# Patient Record
Sex: Male | Born: 1985 | Race: Black or African American | Hispanic: No | Marital: Single | Smoking: Current every day smoker
Health system: Southern US, Community
[De-identification: ages and names within clinical notes are randomized; demographics above are authoritative.]

## PROBLEM LIST (undated history)

## (undated) DIAGNOSIS — G40909 Epilepsy, unspecified, not intractable, without status epilepticus: Secondary | ICD-10-CM

## (undated) DIAGNOSIS — R569 Unspecified convulsions: Secondary | ICD-10-CM

---
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
## 2004-03-23 ENCOUNTER — Emergency Department (HOSPITAL_COMMUNITY): Admission: EM | Admit: 2004-03-23 | Discharge: 2004-03-23 | Payer: Self-pay | Admitting: Emergency Medicine

## 2006-07-10 ENCOUNTER — Emergency Department (HOSPITAL_COMMUNITY): Admission: EM | Admit: 2006-07-10 | Discharge: 2006-07-10 | Payer: Self-pay | Admitting: Emergency Medicine

## 2006-07-30 ENCOUNTER — Emergency Department (HOSPITAL_COMMUNITY): Admission: EM | Admit: 2006-07-30 | Discharge: 2006-07-30 | Payer: Self-pay | Admitting: Family Medicine

## 2007-09-10 ENCOUNTER — Emergency Department (HOSPITAL_COMMUNITY): Admission: EM | Admit: 2007-09-10 | Discharge: 2007-09-10 | Payer: Self-pay | Admitting: Emergency Medicine

## 2009-01-04 ENCOUNTER — Emergency Department (HOSPITAL_COMMUNITY): Admission: EM | Admit: 2009-01-04 | Discharge: 2009-01-04 | Payer: Self-pay | Admitting: Emergency Medicine

## 2009-06-24 ENCOUNTER — Inpatient Hospital Stay (HOSPITAL_COMMUNITY): Admission: EM | Admit: 2009-06-24 | Discharge: 2009-06-25 | Payer: Self-pay | Admitting: Emergency Medicine

## 2009-12-01 ENCOUNTER — Emergency Department (HOSPITAL_COMMUNITY): Admission: EM | Admit: 2009-12-01 | Discharge: 2009-12-01 | Payer: Self-pay | Admitting: Internal Medicine

## 2010-04-02 ENCOUNTER — Emergency Department (HOSPITAL_COMMUNITY)
Admission: EM | Admit: 2010-04-02 | Discharge: 2010-04-02 | Payer: Self-pay | Source: Home / Self Care | Admitting: Emergency Medicine

## 2010-06-16 LAB — URINALYSIS, ROUTINE W REFLEX MICROSCOPIC
Bilirubin Urine: NEGATIVE
Nitrite: NEGATIVE
Specific Gravity, Urine: 1.021 (ref 1.005–1.030)
Urobilinogen, UA: 0.2 mg/dL (ref 0.0–1.0)
pH: 6 (ref 5.0–8.0)

## 2010-06-16 LAB — DIFFERENTIAL
Basophils Absolute: 0 10*3/uL (ref 0.0–0.1)
Lymphocytes Relative: 24 % (ref 12–46)
Lymphs Abs: 2 10*3/uL (ref 0.7–4.0)
Monocytes Absolute: 0.6 10*3/uL (ref 0.1–1.0)
Monocytes Relative: 7 % (ref 3–12)
Neutro Abs: 5.7 10*3/uL (ref 1.7–7.7)
Neutrophils Relative %: 67 % (ref 43–77)

## 2010-06-16 LAB — ETHANOL: Alcohol, Ethyl (B): <5 mg/dL (ref 0–10)

## 2010-06-16 LAB — CBC
MCH: 30.8 pg (ref 26.0–34.0)
MCV: 91.4 fL (ref 78.0–100.0)
Platelets: 168 10*3/uL (ref 150–400)
RBC: 4.87 MIL/uL (ref 4.22–5.81)

## 2010-06-16 LAB — RAPID URINE DRUG SCREEN, HOSP PERFORMED
Amphetamines: NOT DETECTED
Barbiturates: NOT DETECTED

## 2010-06-16 LAB — BASIC METABOLIC PANEL
Creatinine, Ser: 1.35 mg/dL (ref 0.4–1.5)
GFR calc Af Amer: >60 mL/min (ref >60)
GFR calc non Af Amer: >60 mL/min (ref >60)
Sodium: 137 mEq/L (ref 135–145)

## 2010-06-16 LAB — URINE MICROSCOPIC-ADD ON

## 2010-06-29 LAB — COMPREHENSIVE METABOLIC PANEL
ALT: 20 U/L (ref 0–53)
ALT: 23 U/L (ref 0–53)
AST: 28 U/L (ref 0–37)
AST: 34 U/L (ref 0–37)
Albumin: 3.3 g/dL — ABNORMAL LOW (ref 3.5–5.2)
Alkaline Phosphatase: 77 U/L (ref 39–117)
Alkaline Phosphatase: 78 U/L (ref 39–117)
CO2: 24 mEq/L (ref 19–32)
CO2: 25 mEq/L (ref 19–32)
Calcium: 8.5 mg/dL (ref 8.4–10.5)
Chloride: 105 mEq/L (ref 96–112)
GFR calc Af Amer: >60 mL/min (ref >60)
GFR calc Af Amer: >60 mL/min (ref >60)
GFR calc non Af Amer: >60 mL/min (ref >60)
GFR calc non Af Amer: >60 mL/min (ref >60)
Glucose, Bld: 138 mg/dL — ABNORMAL HIGH (ref 70–99)
Potassium: 3.7 mEq/L (ref 3.5–5.1)
Sodium: 136 mEq/L (ref 135–145)
Sodium: 136 mEq/L (ref 135–145)
Total Protein: 7.8 g/dL (ref 6.0–8.3)
Total Protein: 7.9 g/dL (ref 6.0–8.3)

## 2010-06-29 LAB — URINE MICROSCOPIC-ADD ON

## 2010-06-29 LAB — PROTEIN AND GLUCOSE, CSF
Glucose, CSF: 87 mg/dL — ABNORMAL HIGH (ref 43–76)
Total  Protein, CSF: 28 mg/dL (ref 15–45)

## 2010-06-29 LAB — URINALYSIS, ROUTINE W REFLEX MICROSCOPIC
Glucose, UA: NEGATIVE mg/dL
Ketones, ur: NEGATIVE mg/dL
Leukocytes, UA: NEGATIVE
Protein, ur: NEGATIVE mg/dL
Urobilinogen, UA: 0.2 mg/dL (ref 0.0–1.0)

## 2010-06-29 LAB — RAPID URINE DRUG SCREEN, HOSP PERFORMED
Amphetamines: NOT DETECTED
Benzodiazepines: NOT DETECTED
Opiates: NOT DETECTED

## 2010-06-29 LAB — DIFFERENTIAL
Eosinophils Relative: 1 % (ref 0–5)
Lymphs Abs: 2.3 10*3/uL (ref 0.7–4.0)
Neutro Abs: 10.9 10*3/uL — ABNORMAL HIGH (ref 1.7–7.7)
Neutrophils Relative %: 77 % (ref 43–77)

## 2010-06-29 LAB — CSF CELL COUNT WITH DIFFERENTIAL

## 2010-06-29 LAB — CSF CULTURE W GRAM STAIN: Culture: NO GROWTH

## 2010-06-29 LAB — BLOOD GAS, VENOUS
Bicarbonate: 25.3 mEq/L — ABNORMAL HIGH (ref 20.0–24.0)
O2 Saturation: 69.3 %
Patient temperature: 98.6
TCO2: 23.3 mmol/L (ref 0–100)
pCO2, Ven: 52.2 mmHg — ABNORMAL HIGH (ref 45.0–50.0)
pH, Ven: 7.306 — ABNORMAL HIGH (ref 7.250–7.300)
pO2, Ven: 41.7 mmHg (ref 30.0–45.0)

## 2010-06-29 LAB — CARDIAC PANEL(CRET KIN+CKTOT+MB+TROPI)
CK, MB: 1.8 ng/mL (ref 0.3–4.0)
Relative Index: 0.3 (ref 0.0–2.5)
Total CK: 543 U/L — ABNORMAL HIGH (ref 7–232)

## 2010-06-29 LAB — HIV ANTIBODY (ROUTINE TESTING W REFLEX): HIV: NONREACTIVE

## 2010-06-29 LAB — CBC
MCHC: 33 g/dL (ref 30.0–36.0)
MCHC: 33.1 g/dL (ref 30.0–36.0)
RBC: 4.15 MIL/uL — ABNORMAL LOW (ref 4.22–5.81)
RBC: 4.36 MIL/uL (ref 4.22–5.81)
RDW: 14.2 % (ref 11.5–15.5)
WBC: 7.9 10*3/uL (ref 4.0–10.5)

## 2010-06-29 LAB — HEPATITIS PANEL, ACUTE: Hep B C IgM: NEGATIVE

## 2010-06-29 LAB — T.PALLIDUM AB, IGG: T pallidum Antibodies (TP-PA): 1.6 IV — ABNORMAL HIGH (ref <1.0)

## 2010-06-29 LAB — HIV-1 RNA QUANT-NO REFLEX-BLD: HIV 1 RNA Quant: <48 copies/mL (ref <48)

## 2010-06-29 LAB — D-DIMER, QUANTITATIVE: D-Dimer, Quant: 0.49 ug/mL-FEU — ABNORMAL HIGH (ref 0.00–0.48)

## 2010-06-29 LAB — CK TOTAL AND CKMB (NOT AT ARMC): Total CK: 590 U/L — ABNORMAL HIGH (ref 7–232)

## 2010-07-10 LAB — BASIC METABOLIC PANEL
Calcium: 9.3 mg/dL (ref 8.4–10.5)
GFR calc Af Amer: >60 mL/min (ref >60)
GFR calc non Af Amer: >60 mL/min (ref >60)
Sodium: 135 mEq/L (ref 135–145)

## 2010-07-10 LAB — CBC
Hemoglobin: 14.9 g/dL (ref 13.0–17.0)
RBC: 4.6 MIL/uL (ref 4.22–5.81)
RDW: 13.8 % (ref 11.5–15.5)

## 2010-07-10 LAB — RAPID URINE DRUG SCREEN, HOSP PERFORMED
Amphetamines: NOT DETECTED
Benzodiazepines: NOT DETECTED
Tetrahydrocannabinol: POSITIVE — AB

## 2010-07-10 LAB — DIFFERENTIAL
Basophils Absolute: 0 10*3/uL (ref 0.0–0.1)
Lymphocytes Relative: 7 % — ABNORMAL LOW (ref 12–46)
Monocytes Absolute: 0.3 10*3/uL (ref 0.1–1.0)
Monocytes Relative: 2 % — ABNORMAL LOW (ref 3–12)
Neutro Abs: 14.5 10*3/uL — ABNORMAL HIGH (ref 1.7–7.7)

## 2011-01-01 LAB — COMPREHENSIVE METABOLIC PANEL
ALT: 15
Albumin: 4.2
Alkaline Phosphatase: 48
BUN: 10
Chloride: 102
Potassium: 4.2
Total Bilirubin: 0.5

## 2011-01-01 LAB — RAPID URINE DRUG SCREEN, HOSP PERFORMED
Opiates: NOT DETECTED
Tetrahydrocannabinol: POSITIVE — AB

## 2011-08-23 ENCOUNTER — Encounter (HOSPITAL_COMMUNITY): Payer: Self-pay | Admitting: Emergency Medicine

## 2011-08-23 ENCOUNTER — Emergency Department (HOSPITAL_COMMUNITY): Payer: Self-pay

## 2011-08-23 ENCOUNTER — Emergency Department (HOSPITAL_COMMUNITY)
Admission: EM | Admit: 2011-08-23 | Discharge: 2011-08-23 | Disposition: A | Discharge status: Home / Self Care | Payer: Self-pay | Attending: Emergency Medicine | Admitting: Emergency Medicine

## 2011-08-23 DIAGNOSIS — S93409A Sprain of unspecified ligament of unspecified ankle, initial encounter: Secondary | ICD-10-CM | POA: Insufficient documentation

## 2011-08-23 DIAGNOSIS — X58XXXA Exposure to other specified factors, initial encounter: Secondary | ICD-10-CM | POA: Insufficient documentation

## 2011-08-23 DIAGNOSIS — M25579 Pain in unspecified ankle and joints of unspecified foot: Secondary | ICD-10-CM | POA: Insufficient documentation

## 2011-08-23 DIAGNOSIS — M7989 Other specified soft tissue disorders: Secondary | ICD-10-CM | POA: Insufficient documentation

## 2011-08-23 DIAGNOSIS — F17200 Nicotine dependence, unspecified, uncomplicated: Secondary | ICD-10-CM | POA: Insufficient documentation

## 2011-08-23 DIAGNOSIS — Y9367 Activity, basketball: Secondary | ICD-10-CM | POA: Insufficient documentation

## 2011-08-23 HISTORY — DX: Epilepsy, unspecified, not intractable, without status epilepticus: G40.909

## 2011-08-23 HISTORY — DX: Unspecified convulsions: R56.9

## 2011-08-23 MED ORDER — TRAMADOL HCL 50 MG PO TABS: 50.0000 mg | ORAL_TABLET | Freq: Four times a day (QID) | ORAL | Status: AC | PRN

## 2011-08-23 NOTE — ED Notes (Signed)
Pt states he sprained R ankle 2 weeks ago and it is still swollen and painful.

## 2011-08-23 NOTE — ED Provider Notes (Signed)
Medical screening examination/treatment/procedure(s) were performed by non-physician practitioner and as supervising physician I was immediately available for consultation/collaboration.   Tessa Seaberry, MD 08/23/11 2356 

## 2011-08-23 NOTE — Discharge Instructions (Signed)
Ankle Sprain An ankle sprain is an injury to the strong, fibrous tissues (ligaments) that hold the bones of your ankle joint together.  CAUSES Ankle sprain usually is caused by a fall or by twisting your ankle. People who participate in sports are more prone to these types of injuries.  SYMPTOMS  Symptoms of ankle sprain include:  Pain in your ankle. The pain may be present at rest or only when you are trying to stand or walk.   Swelling.   Bruising. Bruising may develop immediately or within 1 to 2 days after your injury.   Difficulty standing or walking.  DIAGNOSIS  Your caregiver will ask you details about your injury and perform a physical exam of your ankle to determine if you have an ankle sprain. During the physical exam, your caregiver will press and squeeze specific areas of your foot and ankle. Your caregiver will try to move your ankle in certain ways. An X-ray exam may be done to be sure a bone was not broken or a ligament did not separate from one of the bones in your ankle (avulsion).  TREATMENT  Certain types of braces can help stabilize your ankle. Your caregiver can make a recommendation for this. Your caregiver may recommend the use of medication for pain. If your sprain is severe, your caregiver may refer you to a surgeon who helps to restore function to parts of your skeletal system (orthopedist) or a physical therapist. HOME CARE INSTRUCTIONS  Apply ice to your injury for 1 to 2 days or as directed by your caregiver. Applying ice helps to reduce inflammation and pain.  Put ice in a plastic bag.   Place a towel between your skin and the bag.   Leave the ice on for 15 to 20 minutes at a time, every 2 hours while you are awake.   Take over-the-counter or prescription medicines for pain, discomfort, or fever only as directed by your caregiver.   Keep your injured leg elevated, when possible, to lessen swelling.   If your caregiver recommends crutches, use them as  instructed. Gradually, put weight on the affected ankle. Continue to use crutches or a cane until you can walk without feeling pain in your ankle.   If you have a plaster splint, wear the splint as directed by your caregiver. Do not rest it on anything harder than a pillow the first 24 hours. Do not put weight on it. Do not get it wet. You may take it off to take a shower or bath.   You may have been given an elastic bandage to wear around your ankle to provide support. If the elastic bandage is too tight (you have numbness or tingling in your foot or your foot becomes cold and blue), adjust the bandage to make it comfortable.   If you have an air splint, you may blow more air into it or let air out to make it more comfortable. You may take your splint off at night and before taking a shower or bath.   Wiggle your toes in the splint several times per day if you are able.  SEEK MEDICAL CARE IF:   You have an increase in bruising, swelling, or pain.   Your toes feel cold.   Pain relief is not achieved with medication.  SEEK IMMEDIATE MEDICAL CARE IF: Your toes are numb or blue or you have severe pain. MAKE SURE YOU:   Understand these instructions.   Will watch your condition.     Will get help right away if you are not doing well or get worse.  Document Released: 03/23/2005 Document Revised: 03/12/2011 Document Reviewed: 10/26/2007 [facility] Patient Information 2012 [place], [place].Acute Ankle Sprain with Phase I Rehab An acute ankle sprain is a partial or complete tear in one or more of the ligaments of the ankle due to traumatic injury. The severity of the injury depends on both the the number of ligaments sprained and the grade of sprain. There are 3 grades of sprains.   A grade 1 sprain is a mild sprain. There is a slight pull without obvious tearing. There is no loss of strength, and the muscle and ligament are the correct length.   A grade 2 sprain is a moderate sprain. There is  tearing of fibers within the substance of the ligament where it connects two bones or two cartilages. The length of the ligament is increased, and there is usually decreased strength.   A grade 3 sprain is a complete rupture of the ligament and is uncommon.  In addition to the grade of sprain, there are three types of ankle sprains.  Lateral ankle sprains: This is a sprain of one or more of the three ligaments on the outer side (lateral) of the ankle. These are the most common sprains. Medial ankle sprains: There is one large triangular ligament of the inner side (medial) of the ankle that is susceptible to injury. Medial ankle sprains are less common. Syndesmosis, "high ankle," sprains: The syndesmosis is the ligament that connects the two bones of the lower leg. Syndesmosis sprains usually only occur with very severe ankle sprains. SYMPTOMS  Pain, tenderness, and swelling in the ankle, starting at the side of injury that may progress to the whole ankle and foot with time.   "Pop" or tearing sensation at the time of injury.   Bruising that may spread to the heel.   Impaired ability to walk soon after injury.  CAUSES   Acute ankle sprains are caused by trauma placed on the ankle that temporarily forces or pries the anklebone (talus) out of its normal socket.   Stretching or tearing of the ligaments that normally hold the joint in place (usually due to a twisting injury).  RISK INCREASES WITH:  Previous ankle sprain.   Sports in which the foot may land awkwardly (ie. basketball, volleyball, or soccer) or walking or running on uneven or rough surfaces.   Shoes with inadequate support to prevent sideways motion when stress occurs.   Poor strength and flexibility.   Poor balance skills.   Contact sports.  PREVENTION   Warm up and stretch properly before activity.   Maintain physical fitness:   Ankle and leg flexibility, muscle strength, and endurance.   Cardiovascular fitness.     Balance training activities.   Use proper technique and have a coach correct improper technique.   Taping, protective strapping, bracing, or high-top tennis shoes may help prevent injury. Initially, tape is best; however, it loses most of its support function within 10 to 15 minutes.   Wear proper fitted protective shoes (High-top shoes with taping or bracing is more effective than either alone).   Provide the ankle with support during sports and practice activities for 12 months following injury.  PROGNOSIS   If treated properly, ankle sprains can be expected to recover completely; however, the length of recovery depends on the degree of injury.   A grade 1 sprain usually heals enough in 5 to 7 days to allow  modified activity and requires an average of 6 weeks to heal completely.   A grade 2 sprain requires 6 to 10 weeks to heal completely.   A grade 3 sprain requires 12 to 16 weeks to heal.   A syndesmosis sprain often takes more than 3 months to heal.  RELATED COMPLICATIONS   Frequent recurrence of symptoms may result in a chronic problem. Appropriately addressing the problem the first time decreases the frequency of recurrence and optimizes healing time. Severity of the initial sprain does not predict the likelihood of later instability.   Injury to other structures (bone, cartilage, or tendon).   A chronically unstable or arthritic ankle joint is a possiblity with repeated sprains.  TREATMENT Treatment initially involves the use of ice, medication, and compression bandages to help reduce pain and inflammation. Ankle sprains are usually immobilized in a walking cast or boot to allow for healing. Crutches may be recommended to reduce pressure on the injury. After immobilization, strengthening and stretching exercises may be necessary to regain strength and a full range of motion. Surgery is rarely needed to treat ankle sprains. MEDICATION   Nonsteroidal anti-inflammatory  medications, such as aspirin and ibuprofen (do not take for the first 3 days after injury or within 7 days before surgery), or other minor pain relievers, such as acetaminophen, are often recommended. Take these as directed by your caregiver. Contact your caregiver immediately if any bleeding, stomach upset, or signs of an allergic reaction occur from these medications.   Ointments applied to the skin may be helpful.   Pain relievers may be prescribed as necessary by your caregiver. Do not take prescription pain medication for longer than 4 to 7 days. Use only as directed and only as much as you need.  HEAT AND COLD  Cold treatment (icing) is used to relieve pain and reduce inflammation for acute and chronic cases. Cold should be applied for 10 to 15 minutes every 2 to 3 hours for inflammation and pain and immediately after any activity that aggravates your symptoms. Use ice packs or an ice massage.   Heat treatment may be used before performing stretching and strengthening activities prescribed by your caregiver. Use a heat pack or a warm soak.  SEEK IMMEDIATE MEDICAL CARE IF:   Pain, swelling, or bruising worsens despite treatment.   You experience pain, numbness, discoloration, or coldness in the foot or toes.   New, unexplained symptoms develop (drugs used in treatment may produce side effects.)  EXERCISES  PHASE I EXERCISES RANGE OF MOTION (ROM) AND STRETCHING EXERCISES - Ankle Sprain, Acute Phase I, Weeks 1 to 2 These exercises may help you when beginning to restore flexibility in your ankle. You will likely work on these exercises for the 1 to 2 weeks after your injury. Once your physician, physical therapist, or athletic trainer sees adequate progress, he or she will advance your exercises. While completing these exercises, remember:   Restoring tissue flexibility helps normal motion to return to the joints. This allows healthier, less painful movement and activity.   An effective  stretch should be held for at least 30 seconds.   A stretch should never be painful. You should only feel a gentle lengthening or release in the stretched tissue.  RANGE OF MOTION - Dorsi/Plantar Flexion  While sitting with your right / left knee straight, draw the top of your foot upwards by flexing your ankle. Then reverse the motion, pointing your toes downward.   Hold each position for __________ seconds.  After completing your first set of exercises, repeat this exercise with your knee bent.  Repeat __________ times. Complete this exercise __________ times per day.  RANGE OF MOTION - Ankle Alphabet  Imagine your right / left big toe is a pen.   Keeping your hip and knee still, write out the entire alphabet with your "pen." Make the letters as large as you can without increasing any discomfort.  Repeat __________ times. Complete this exercise __________ times per day.  STRENGTHENING EXERCISES - Ankle Sprain, Acute -Phase I, Weeks 1 to 2 These exercises may help you when beginning to restore strength in your ankle. You will likely work on these exercises for 1 to 2 weeks after your injury. Once your physician, physical therapist, or athletic trainer sees adequate progress, he or she will advance your exercises. While completing these exercises, remember:   Muscles can gain both the endurance and the strength needed for everyday activities through controlled exercises.   Complete these exercises as instructed by your physician, physical therapist, or athletic trainer. Progress the resistance and repetitions only as guided.   You may experience muscle soreness or fatigue, but the pain or discomfort you are trying to eliminate should never worsen during these exercises. If this pain does worsen, stop and make certain you are following the directions exactly. If the pain is still present after adjustments, discontinue the exercise until you can discuss the trouble with your clinician.    STRENGTH - Dorsiflexors  Secure a rubber exercise band/tubing to a fixed object (ie. table, pole) and loop the other end around your right / left foot.   Sit on the floor facing the fixed object. The band/tubing should be slightly tense when your foot is relaxed.   Slowly draw your foot back toward you using your ankle and toes.   Hold this position for __________ seconds. Slowly release the tension in the band and return your foot to the starting position.  Repeat __________ times. Complete this exercise __________ times per day.  STRENGTH - Plantar-flexors   Sit with your right / left leg extended. Holding onto both ends of a rubber exercise band/tubing, loop it around the ball of your foot. Keep a slight tension in the band.   Slowly push your toes away from you, pointing them downward.   Hold this position for __________ seconds. Return slowly, controlling the tension in the band/tubing.  Repeat __________ times. Complete this exercise __________ times per day.  STRENGTH - Ankle Eversion  Secure one end of a rubber exercise band/tubing to a fixed object (table, pole). Loop the other end around your foot just before your toes.   Place your fists between your knees. This will focus your strengthening at your ankle.   Drawing the band/tubing across your opposite foot, slowly, pull your little toe out and up. Make sure the band/tubing is positioned to resist the entire motion.   Hold this position for __________ seconds.  Have your muscles resist the band/tubing as it slowly pulls your foot back to the starting position.  Repeat __________ times. Complete this exercise __________ times per day.  STRENGTH - Ankle Inversion  Secure one end of a rubber exercise band/tubing to a fixed object (table, pole). Loop the other end around your foot just before your toes.   Place your fists between your knees. This will focus your strengthening at your ankle.   Slowly, pull your big toe up  and in, making sure the band/tubing is positioned to  resist the entire motion.   Hold this position for __________ seconds.   Have your muscles resist the band/tubing as it slowly pulls your foot back to the starting position.  Repeat __________ times. Complete this exercises __________ times per day.  STRENGTH - Towel Curls  Sit in a chair positioned on a non-carpeted surface.   Place your right / left foot on a towel, keeping your heel on the floor.   Pull the towel toward your heel by only curling your toes. Keep your heel on the floor.   If instructed by your physician, physical therapist, or athletic trainer, add weight to the end of the towel.  Repeat __________ times. Complete this exercise __________ times per day. Document Released: 10/22/2004 Document Revised: 03/12/2011 Document Reviewed: 07/05/2008 [facility] Patient Information 2012 [place], [place]. Perform each.  Exercise 5 times.  Complete them 1-2 times a day

## 2011-08-23 NOTE — ED Notes (Signed)
Pt reports having injured both ankles by stepping down off of a curb - about a week ago.  Pt states the pain and swelling has not gotten better.  Pt reports right ankles is worse than the left.

## 2011-08-23 NOTE — ED Provider Notes (Signed)
History     CSN: [id number]  Arrival date & time 08/23/11  Barry Brunner   First MD Initiated Contact with Patient 08/23/11 2013      Chief Complaint  Patient presents with  . Ankle Pain    (Consider location/radiation/quality/duration/timing/severity/associated sxs/prior treatment) HPI Comments: She states he sprained both ankles are week and half, ago, playing basketball.  The left ankle got better, but the right ankle is persistently swollen.  He states that he has sprained his ankle.  Multiple times in the past has been taking ibuprofen, without relief.  He did try wrapping it initially, with an Ace bandage, but has not been using any kind of support to it.  In the past week  Patient is a 26 y.o. male presenting with ankle pain. The history is provided by the patient.  Ankle Pain  The incident occurred more than 1 week ago. The incident occurred at the gym. The injury mechanism was torsion.    Past Medical History  Diagnosis Date  . Seizures   . Epilepsy     History reviewed. No pertinent past surgical history.  No family history on file.  History  Substance Use Topics  . Smoking status: Current Everyday Smoker  . Smokeless tobacco: Not on file  . Alcohol Use: No      Review of Systems  Musculoskeletal: Positive for joint swelling.    Allergies  Review of patient's allergies indicates no known allergies.  Home Medications   Current Outpatient Rx  Name Route Sig Dispense Refill  . TRAMADOL HCL 50 MG PO TABS Oral Take 1 tablet (50 mg total) by mouth every 6 (six) hours as needed for pain. 15 tablet 0    BP 132/75  Pulse 84  Temp(Src) 97.9 F (36.6 C) (Oral)  Resp 20  SpO2 99%  Physical Exam  Constitutional: He is oriented to person, place, and time. He appears well-developed.  HENT:  Head: Normocephalic.  Neck: Normal range of motion.  Cardiovascular: Normal rate.   Pulmonary/Chest: He is in respiratory distress.  Musculoskeletal:       Right ankle is  swollen medially and laterally, lateral being greater than medial.  Has full range of motion, cap refill is less than 3 seconds color, and temperature are normal.  Compared to his left foot  Neurological: He is alert and oriented to person, place, and time.  Skin: Skin is warm and dry.    ED Course  Procedures (including critical care time)  Labs Reviewed - No data to display Dg Ankle Complete Right  08/23/2011  *RADIOLOGY REPORT*  Clinical Data: Ankle pain  RIGHT ANKLE - COMPLETE 3+ VIEW  Comparison: None.  Findings: Ankle mortise intact.  The talar dome is normal.  No malleolar fracture.  No joint effusion.  IMPRESSION: No ankle fracture.  Original Report Authenticated By: Genevive Bi, M.D.     1. Ankle sprain       MDM  srained ankle, with full range of       Arman Filter, NP 08/23/11 2103  Arman Filter, NP 08/23/11 2104

## 2013-05-15 ENCOUNTER — Emergency Department (HOSPITAL_COMMUNITY)
Admission: EM | Admit: 2013-05-15 | Discharge: 2013-05-16 | Disposition: A | Discharge status: Other Acute Inpatient Hospital | Payer: Medicaid Other | Attending: Emergency Medicine | Admitting: Emergency Medicine

## 2013-05-15 ENCOUNTER — Emergency Department (HOSPITAL_COMMUNITY): Payer: Medicaid Other

## 2013-05-15 ENCOUNTER — Encounter (HOSPITAL_COMMUNITY): Payer: Self-pay | Admitting: Emergency Medicine

## 2013-05-15 DIAGNOSIS — S99922A Unspecified injury of left foot, initial encounter: Secondary | ICD-10-CM

## 2013-05-15 DIAGNOSIS — W230XXA Caught, crushed, jammed, or pinched between moving objects, initial encounter: Secondary | ICD-10-CM | POA: Insufficient documentation

## 2013-05-15 DIAGNOSIS — S92309A Fracture of unspecified metatarsal bone(s), unspecified foot, initial encounter for closed fracture: Secondary | ICD-10-CM | POA: Diagnosis not present

## 2013-05-15 DIAGNOSIS — F17200 Nicotine dependence, unspecified, uncomplicated: Secondary | ICD-10-CM | POA: Insufficient documentation

## 2013-05-15 DIAGNOSIS — Y939 Activity, unspecified: Secondary | ICD-10-CM | POA: Diagnosis not present

## 2013-05-15 DIAGNOSIS — S8990XA Unspecified injury of unspecified lower leg, initial encounter: Secondary | ICD-10-CM | POA: Diagnosis present

## 2013-05-15 DIAGNOSIS — Y929 Unspecified place or not applicable: Secondary | ICD-10-CM | POA: Insufficient documentation

## 2013-05-15 DIAGNOSIS — S99919A Unspecified injury of unspecified ankle, initial encounter: Secondary | ICD-10-CM | POA: Diagnosis present

## 2013-05-15 DIAGNOSIS — IMO0002 Reserved for concepts with insufficient information to code with codable children: Secondary | ICD-10-CM

## 2013-05-15 DIAGNOSIS — S93326A Dislocation of tarsometatarsal joint of unspecified foot, initial encounter: Secondary | ICD-10-CM

## 2013-05-15 DIAGNOSIS — Z8669 Personal history of other diseases of the nervous system and sense organs: Secondary | ICD-10-CM | POA: Diagnosis not present

## 2013-05-15 MED ORDER — OXYCODONE-ACETAMINOPHEN 5-325 MG PO TABS
1.0000 | ORAL_TABLET | Freq: Once | ORAL | Status: AC
  Administered 2013-05-15: 1 via ORAL
  Filled 2013-05-15: qty 1

## 2013-05-15 NOTE — ED Notes (Signed)
Pt reports that a palate of blocks fell on his L foot at 2200. Pt unable to ambulate on foot, deformity noted.

## 2013-05-15 NOTE — ED Notes (Signed)
Pt is A&O x3, c/o pain 10/10 in left foot.  Obvious deformity, edematous unable to wiggle toes.  +pulse.

## 2013-05-16 DIAGNOSIS — Y939 Activity, unspecified: Secondary | ICD-10-CM | POA: Diagnosis not present

## 2013-05-16 DIAGNOSIS — F17200 Nicotine dependence, unspecified, uncomplicated: Secondary | ICD-10-CM | POA: Diagnosis not present

## 2013-05-16 DIAGNOSIS — S8990XA Unspecified injury of unspecified lower leg, initial encounter: Secondary | ICD-10-CM | POA: Diagnosis present

## 2013-05-16 DIAGNOSIS — Z8669 Personal history of other diseases of the nervous system and sense organs: Secondary | ICD-10-CM | POA: Diagnosis not present

## 2013-05-16 DIAGNOSIS — W230XXA Caught, crushed, jammed, or pinched between moving objects, initial encounter: Secondary | ICD-10-CM | POA: Diagnosis not present

## 2013-05-16 DIAGNOSIS — Y929 Unspecified place or not applicable: Secondary | ICD-10-CM | POA: Diagnosis not present

## 2013-05-16 DIAGNOSIS — S92309A Fracture of unspecified metatarsal bone(s), unspecified foot, initial encounter for closed fracture: Secondary | ICD-10-CM | POA: Diagnosis not present

## 2013-05-16 LAB — COMPREHENSIVE METABOLIC PANEL
ALT: 30 U/L (ref 0–53)
AST: 44 U/L — AB (ref 0–37)
Albumin: 4.2 g/dL (ref 3.5–5.2)
Alkaline Phosphatase: 64 U/L (ref 39–117)
BUN: 17 mg/dL (ref 6–23)
CO2: 25 mEq/L (ref 19–32)
CREATININE: 1.23 mg/dL (ref 0.50–1.35)
Calcium: 9.1 mg/dL (ref 8.4–10.5)
Chloride: 98 mEq/L (ref 96–112)
GFR calc Af Amer: >90 mL/min (ref >90)
GFR calc non Af Amer: 78 mL/min — ABNORMAL LOW (ref >90)
Glucose, Bld: 103 mg/dL — ABNORMAL HIGH (ref 70–99)
Potassium: 4 mEq/L (ref 3.7–5.3)
Sodium: 135 mEq/L — ABNORMAL LOW (ref 137–147)
TOTAL PROTEIN: 8.3 g/dL (ref 6.0–8.3)
Total Bilirubin: <0.2 mg/dL — ABNORMAL LOW (ref 0.3–1.2)

## 2013-05-16 LAB — CBC WITH DIFFERENTIAL/PLATELET
Basophils Absolute: 0 10*3/uL (ref 0.0–0.1)
Basophils Relative: 0 % (ref 0–1)
EOS PCT: 1 % (ref 0–5)
Eosinophils Absolute: 0.1 10*3/uL (ref 0.0–0.7)
HEMATOCRIT: 40.8 % (ref 39.0–52.0)
Hemoglobin: 14.1 g/dL (ref 13.0–17.0)
Lymphocytes Relative: 12 % (ref 12–46)
Lymphs Abs: 1 10*3/uL (ref 0.7–4.0)
MCH: 31.5 pg (ref 26.0–34.0)
MCHC: 34.6 g/dL (ref 30.0–36.0)
MCV: 91.3 fL (ref 78.0–100.0)
MONO ABS: 0.9 10*3/uL (ref 0.1–1.0)
Monocytes Relative: 11 % (ref 3–12)
Neutro Abs: 6 10*3/uL (ref 1.7–7.7)
Neutrophils Relative %: 76 % (ref 43–77)
Platelets: 150 10*3/uL (ref 150–400)
RBC: 4.47 MIL/uL (ref 4.22–5.81)
RDW: 13.4 % (ref 11.5–15.5)
WBC: 7.9 10*3/uL (ref 4.0–10.5)

## 2013-05-16 MED ORDER — MORPHINE SULFATE 2 MG/ML IJ SOLN
2.0000 mg | Freq: Once | INTRAMUSCULAR | Status: AC
  Administered 2013-05-16: 2 mg via INTRAVENOUS
  Filled 2013-05-16: qty 1

## 2013-05-16 MED ORDER — ONDANSETRON HCL 4 MG/2ML IJ SOLN
4.0000 mg | Freq: Once | INTRAMUSCULAR | Status: AC
  Administered 2013-05-16: 4 mg via INTRAVENOUS
  Filled 2013-05-16: qty 2

## 2013-05-16 MED ORDER — SODIUM CHLORIDE 0.9 % IV BOLUS (SEPSIS)
500.0000 mL | Freq: Once | INTRAVENOUS | Status: AC
Start: 2013-05-16 — End: 2013-05-16
  Administered 2013-05-16: 500 mL via INTRAVENOUS

## 2013-05-16 MED ORDER — MORPHINE SULFATE 4 MG/ML IJ SOLN
4.0000 mg | Freq: Once | INTRAMUSCULAR | Status: AC
  Administered 2013-05-16: 4 mg via INTRAVENOUS
  Filled 2013-05-16: qty 1

## 2013-05-16 NOTE — ED Provider Notes (Signed)
CSN: [id number]     Arrival date & time 05/15/13  2303 History   First MD Initiated Contact with Patient 05/15/13 2323     Chief Complaint  Patient presents with  . Foot Pain     (Consider location/radiation/quality/duration/timing/severity/associated sxs/prior Treatment) The history is provided by the patient. No language interpreter was used.  Javier Moore is a 28 y/o M with PMHx of seizures/epilepsy with no medication regimen presenting to the ED with a left foot injury that occurred this evening at approximately 11:00PM. Patient reported that a palate of bricks landed on his left foot. Patient reported that he has been experiencing stabbing, sharp, aching pain to the left foot with radiation up to the left knee. Patient reported that he has been having numbness in his left great toe. Patient reported that he has never had an injury to his foot before. Reported that he did not take any medications or apply any ice - reported that he came straight to the ED. denied loss of sensation, head injury, loss of consciousness, chest pain, shortness of breath, difficulty breathing. PCP none   Past Medical History  Diagnosis Date  . Seizures   . Epilepsy    History reviewed. No pertinent past surgical history. History reviewed. No pertinent family history. History  Substance Use Topics  . Smoking status: Current Every Day Smoker  . Smokeless tobacco: Not on file  . Alcohol Use: No    Review of Systems  Constitutional: Negative for fever and chills.  Respiratory: Negative for chest tightness and shortness of breath.   Cardiovascular: Negative for chest pain.  Musculoskeletal: Positive for arthralgias (Left foot pain).  Neurological: Positive for numbness. Negative for weakness.  All other systems reviewed and are negative.      Allergies  Review of patient's allergies indicates no known allergies.  Home Medications  No current outpatient prescriptions on file. BP 144/91  Pulse 76   Temp(Src) 98.1 F (36.7 C) (Oral)  Resp 18  SpO2 99% Physical Exam  Nursing note and vitals reviewed. Constitutional: He is oriented to person, place, and time. He appears well-developed and well-nourished. No distress.  HENT:  Head: Normocephalic and atraumatic.  Cardiovascular: Normal rate, regular rhythm and normal heart sounds.   Pulses:      Radial pulses are 2+ on the right side, and 2+ on the left side.       Dorsalis pedis pulses are 2+ on the left side.       Posterior tibial pulses are 2+ on the left side.  Cap refill < 3 seconds   Pulmonary/Chest: Effort normal and breath sounds normal. No respiratory distress. He has no wheezes. He has no rales.  Musculoskeletal: He exhibits tenderness.       Feet:  Significant swelling localized to the dorsal aspect of the left foot. Negative puncture wounds identified. Negative ecchymosis noted. Decreased range of motion to the left great toe secondary to pain. Full range of motion to remaining digits of the left foot. Discomfort upon palpation to the left great toe and dorsal aspect of the left foot as well as plantar aspect of the left foot.  Neurological: He is alert and oriented to person, place, and time. No cranial nerve deficit. He exhibits normal muscle tone. Coordination normal.  Cranial nerves III-XII grossly intact Decrease strength to the left great toe secondary to pain  Sensation intact to the left foot, left lower extremity with differentiation to sharp and dull touch-mild decrease in sensation  to the left great toe  Skin: Skin is warm and dry. No rash noted. He is not diaphoretic. No erythema.  Psychiatric: He has a normal mood and affect. His behavior is normal. Thought content normal.    ED Course  Procedures (including critical care time)  12:24 AM Dr. Fleet Contras spoke with Dr. Roda Shutters, orthopedics who reported that he will review the plain films and discuss the findings.   12:39 AM Dr. Fleet Contras spoke with Dr. Roda Shutters,  orthopedic surgeon, who recommended that [facility] be notified with a specialist in foot to be contacted for possible transfer.   12:42 AM This provider spoke with Dr. Dorris Carnes from [facility] - orthopedic surgeon - discussed case, history, presentation, physical exam, imaging results. Patient to be placed in a posterior splint, short leg and to be transferred to [facility]. This is to be a ER to ER transfer. Discussed plan for transfer to [facility]-patient stable for transfer, agreed to plan.  1:47 AM This provider spoke with Dr. Rushie Nyhan, [facility] emergency Department-discussed case, plan for transfer. Discussed with Dr. Dorris Carnes from orthopedic surgery is well aware that patient is to be transferred.  Results for orders placed during the hospital encounter of 05/15/13  CBC WITH DIFFERENTIAL      Result Value Range   WBC 7.9  4.0 - 10.5 K/uL   RBC 4.47  4.22 - 5.81 MIL/uL   Hemoglobin 14.1  13.0 - 17.0 g/dL   HCT 16.1  09.6 - 04.5 %   MCV 91.3  78.0 - 100.0 fL   MCH 31.5  26.0 - 34.0 pg   MCHC 34.6  30.0 - 36.0 g/dL   RDW 40.9  81.1 - 91.4 %   Platelets 150  150 - 400 K/uL   Neutrophils Relative % 76  43 - 77 %   Neutro Abs 6.0  1.7 - 7.7 K/uL   Lymphocytes Relative 12  12 - 46 %   Lymphs Abs 1.0  0.7 - 4.0 K/uL   Monocytes Relative 11  3 - 12 %   Monocytes Absolute 0.9  0.1 - 1.0 K/uL   Eosinophils Relative 1  0 - 5 %   Eosinophils Absolute 0.1  0.0 - 0.7 K/uL   Basophils Relative 0  0 - 1 %   Basophils Absolute 0.0  0.0 - 0.1 K/uL  COMPREHENSIVE METABOLIC PANEL      Result Value Range   Sodium 135 (*) 137 - 147 mEq/L   Potassium 4.0  3.7 - 5.3 mEq/L   Chloride 98  96 - 112 mEq/L   CO2 25  19 - 32 mEq/L   Glucose, Bld 103 (*) 70 - 99 mg/dL   BUN 17  6 - 23 mg/dL   Creatinine, Ser 7.82  0.50 - 1.35 mg/dL   Calcium 9.1  8.4 - 95.6 mg/dL   Total Protein 8.3  6.0 - 8.3 g/dL   Albumin 4.2  3.5 - 5.2 g/dL   AST 44 (*) 0 - 37 U/L   ALT 30  0 - 53 U/L   Alkaline Phosphatase 64  39  - 117 U/L   Total Bilirubin <0.2 (*) 0.3 - 1.2 mg/dL   GFR calc non Af Amer 78 (*) >90 mL/min   GFR calc Af Amer >90  >90 mL/min     Dg Foot Complete Left  05/15/2013   CLINICAL DATA:  Obvious foot deformity. Crush injury to the foot. Load of bricks fell on patient's foot.  EXAM:  LEFT FOOT - COMPLETE 3+ VIEW  COMPARISON:  None.  FINDINGS: Fracture dislocation at the tarsometatarsal junction is present. There is dorsal dislocation of the second through fourth metatarsals with fracture of the plantar aspect of the metatarsal bases. There is lateral dislocation of the first metatarsal base, now 1 line with the middle cuneiform. A fragment of the first metatarsal base remains approximated to the medial cuneiform. Complete disruption of the Lisfranc joint. There is also plantar dislocation of the first metatarsal head with shortening of the first toe. Calcaneus and subtalar joints appear intact. There is distraction of the calcaneocuboid joint. Talonavicular joint remains approximated. Phalanges and metatarsal shafts appear intact. There are almost certainly fractures of the cuneiform bones at the tarsometatarsal junction.  IMPRESSION: 1. Complex midfoot fracture dislocation with lateral and dorsal dislocation of the second through fourth metatarsal bases. 2. Plantar dislocation of the first MT head.   Electronically Signed   By: Andreas Newport M.D.   On: 05/15/2013 23:49   Labs Review Labs Reviewed  COMPREHENSIVE METABOLIC PANEL - Abnormal; Notable for the following:    Sodium 135 (*)    Glucose, Bld 103 (*)    AST 44 (*)    Total Bilirubin <0.2 (*)    GFR calc non Af Amer 78 (*)    All other components within normal limits  CBC WITH DIFFERENTIAL   Imaging Review Dg Foot Complete Left  05/15/2013   CLINICAL DATA:  Obvious foot deformity. Crush injury to the foot. Load of bricks fell on patient's foot.  EXAM: LEFT FOOT - COMPLETE 3+ VIEW  COMPARISON:  None.  FINDINGS: Fracture dislocation at the  tarsometatarsal junction is present. There is dorsal dislocation of the second through fourth metatarsals with fracture of the plantar aspect of the metatarsal bases. There is lateral dislocation of the first metatarsal base, now 1 line with the middle cuneiform. A fragment of the first metatarsal base remains approximated to the medial cuneiform. Complete disruption of the Lisfranc joint. There is also plantar dislocation of the first metatarsal head with shortening of the first toe. Calcaneus and subtalar joints appear intact. There is distraction of the calcaneocuboid joint. Talonavicular joint remains approximated. Phalanges and metatarsal shafts appear intact. There are almost certainly fractures of the cuneiform bones at the tarsometatarsal junction.  IMPRESSION: 1. Complex midfoot fracture dislocation with lateral and dorsal dislocation of the second through fourth metatarsal bases. 2. Plantar dislocation of the first MT head.   Electronically Signed   By: Andreas Newport M.D.   On: 05/15/2013 23:49    EKG Interpretation   None       MDM   Final diagnoses:  Lisfranc dislocation  Injury of left foot  Closed fracture dislocation of joint of lower extremity   Medications  oxyCODONE-acetaminophen (PERCOCET/ROXICET) 5-325 MG per tablet 1 tablet (1 tablet Oral Given 05/15/13 2329)  morphine 4 MG/ML injection 4 mg (4 mg Intravenous Given 05/16/13 0121)  ondansetron (ZOFRAN) injection 4 mg (4 mg Intravenous Given 05/16/13 0121)  sodium chloride 0.9 % bolus 500 mL (500 mLs Intravenous New Bag/Given 05/16/13 0121)  morphine 2 MG/ML injection 2 mg (2 mg Intravenous Given 05/16/13 0223)   Filed Vitals:   05/15/13 2311 05/16/13 0133  BP: 159/90 144/91  Pulse: 87 76  Temp: 98.5 F (36.9 C) 98.1 F (36.7 C)  TempSrc: Oral Oral  Resp: 20 18  SpO2: 100% 99%    Patient presenting to emergency department with left foot injury that occurred at  approximately 11:00 PM yesterday evening. Reported that a  pallet of bricks landed on his left foot while working. Stated that he experienced intense pain described as a stabbing, sharp, shooting pain with radiation up to the left knee. Reported mild numbness to the left great toe. Denied any previous injuries. Alert and oriented. GCS 15. Heart rate and rhythm normal. Lungs clear to auscultation to upper and lower lobes bilaterally. Radial pulses 2+ bilaterally. DP and PT pulses 2+ to the left lower extremity. Notable swelling identified to the dorsal aspect of the left foot with negative signs of ecchymosis or puncture wounds. Discomfort upon palpation to the dorsal aspect of the left foot and left great toe. Decreased range of motion to left great toe secondary to pain. Range of motion identified to the remaining digits of the left foot. Cap refill less than 3 seconds. Sensation intact with differentiation to sharp and dull touch-mild decreased sensation to the left great toe. CBC negative findings. CMP negative abnormalities noted. Left plain film of foot noted fracture dislocation at the torso metatarsal junction with dorsal dislocation of the second through fourth metatarsals with fracture of the plantar aspect of the metatarsal bases-there is lateral dislocation of the first metatarsal base. Complete disruption of the Lisfranc joint. Plantar dislocation of the first metatarsal head with shortening of the first toe. There is distraction of the calcaneocuboid joint there is also fractures of the cuneiform bases at the tarsometatarsal junction. This is a closed fracture. Attending physician, Dr. Hyacinth MeekerMiller saw and assess patient. Spoke with orthopedic surgeon, Dr. Roda ShuttersXu, who [facility] to be consult is regarding these findings and for a foot specialist to be on board. This provider spoke with [facility] Dorris [facility]Shields [facility], orthopedics, discussed with provider regarding the physical exam, imaging results. [facility]  [facility] ER [facility]. Discussed case with Dr. Rushie NyhanStopyra - from [facility], physician aware. Discussed plan for transfer with patient who agreed. Patient placed in a posterior short-leg splint and pain medications administered. Patient neurovascularly intact. Patient stable, afebrile. Patient stable for transfer.    Raymon MuttonMarissa Yenni Carra, PA-C 05/16/13 [phone]

## 2013-05-16 NOTE — ED Provider Notes (Signed)
28 year old male presents with left foot injury after dropping bricks on the midfoot. Acute onset of pain, persistent, severe swelling and pain with numbness to the left great toe. On exam he has a severely swollen midfoot, decreased sensation to the left great toe.  X-ray results reviewed, he has significant injuries including multiple dislocations and fractures of the midfoot. Discussed with Dr. Rhona Leavenshiu who recommends transfer to a larger trauma center with foot and ankle specialty. Will discuss [facility].  Medications ordered, preop labs ordered. The patient otherwise appears stable with no other major injuries.  Medical screening examination/treatment/procedure(s) were conducted as a shared visit with non-physician practitioner(s) and myself.  I personally evaluated the patient during the encounter.  Clinical Impression: Fracture or dislocation of left midfoot      Vida RollerBrian D Anitha Kreiser, MD 05/16/13 [phone]

## 2013-05-17 NOTE — ED Provider Notes (Signed)
28 year old male presents with left foot injury after dropping bricks on the midfoot. Acute onset of pain, persistent, severe swelling and pain with numbness to the left great toe. On exam he has a severely swollen midfoot, decreased sensation to the left great toe.   X-ray results reviewed, he has significant injuries including multiple dislocations and fractures of the midfoot.   Discussed with Dr. Rhona Leavenshiu who recommends transfer to a larger trauma center with foot and ankle specialty. [facility].   Medications ordered, preop labs ordered. The patient otherwise appears stable with no other major injuries.   Medical screening examination/treatment/procedure(s) were conducted as a shared visit with non-physician practitioner(s) and myself. I personally evaluated the patient during the encounter.   Clinical Impression: Fracture or dislocation of left midfoot   Vida RollerBrian D Equilla Que, MD 05/17/13 1249

## 2013-12-01 IMAGING — CR DG ANKLE COMPLETE 3+V*R*
3 series · 3 of 3 positions shown · non-contrast
Comparison: None.

CLINICAL DATA: Ankle pain

RIGHT ANKLE - COMPLETE 3+ VIEW

[t ankle joint ap right]
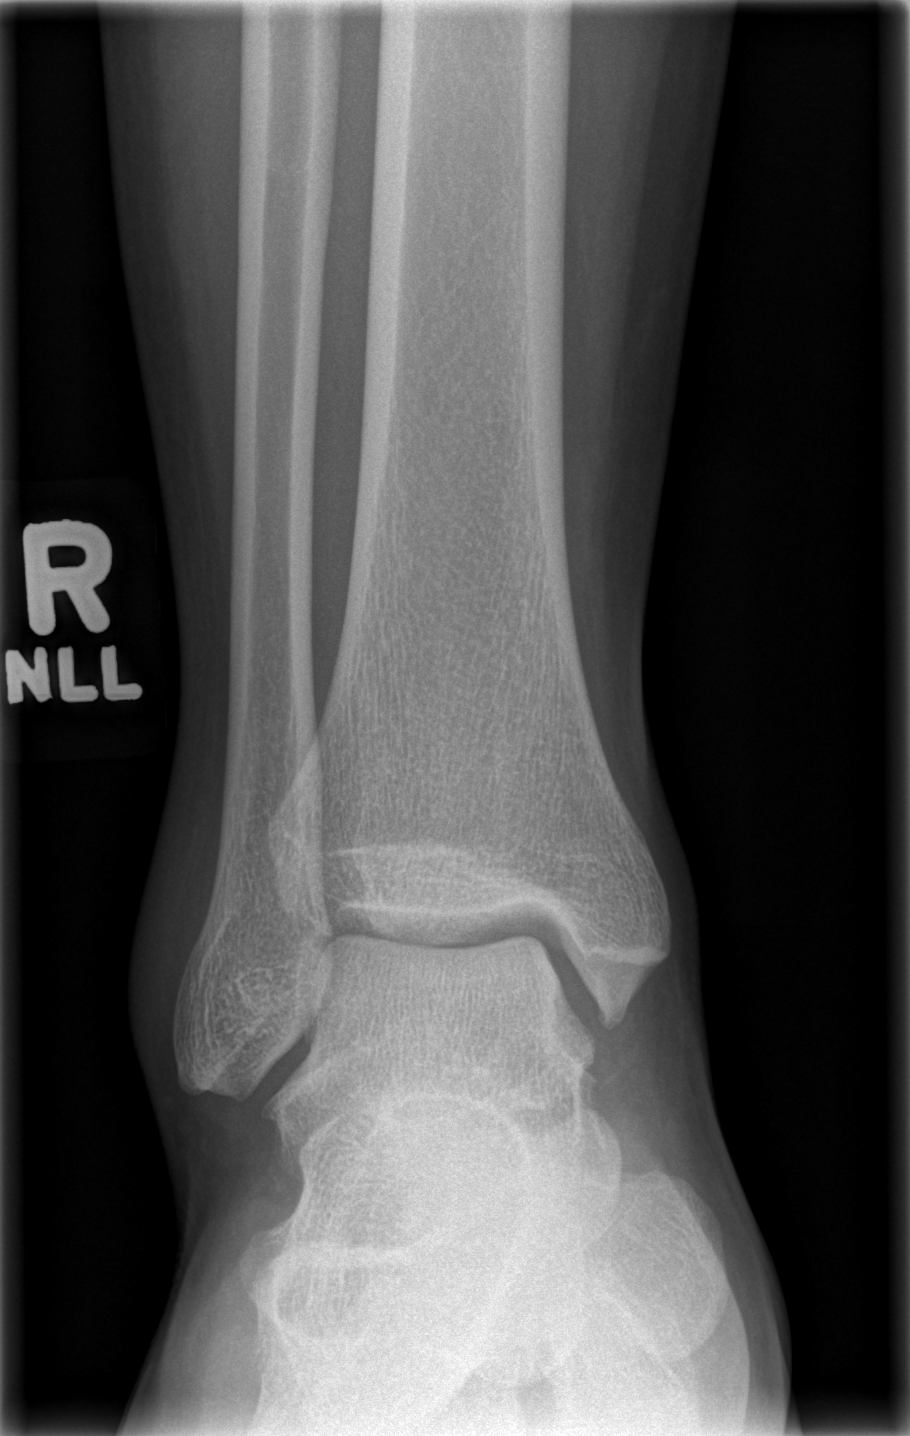

[t ankle joint oblique right]
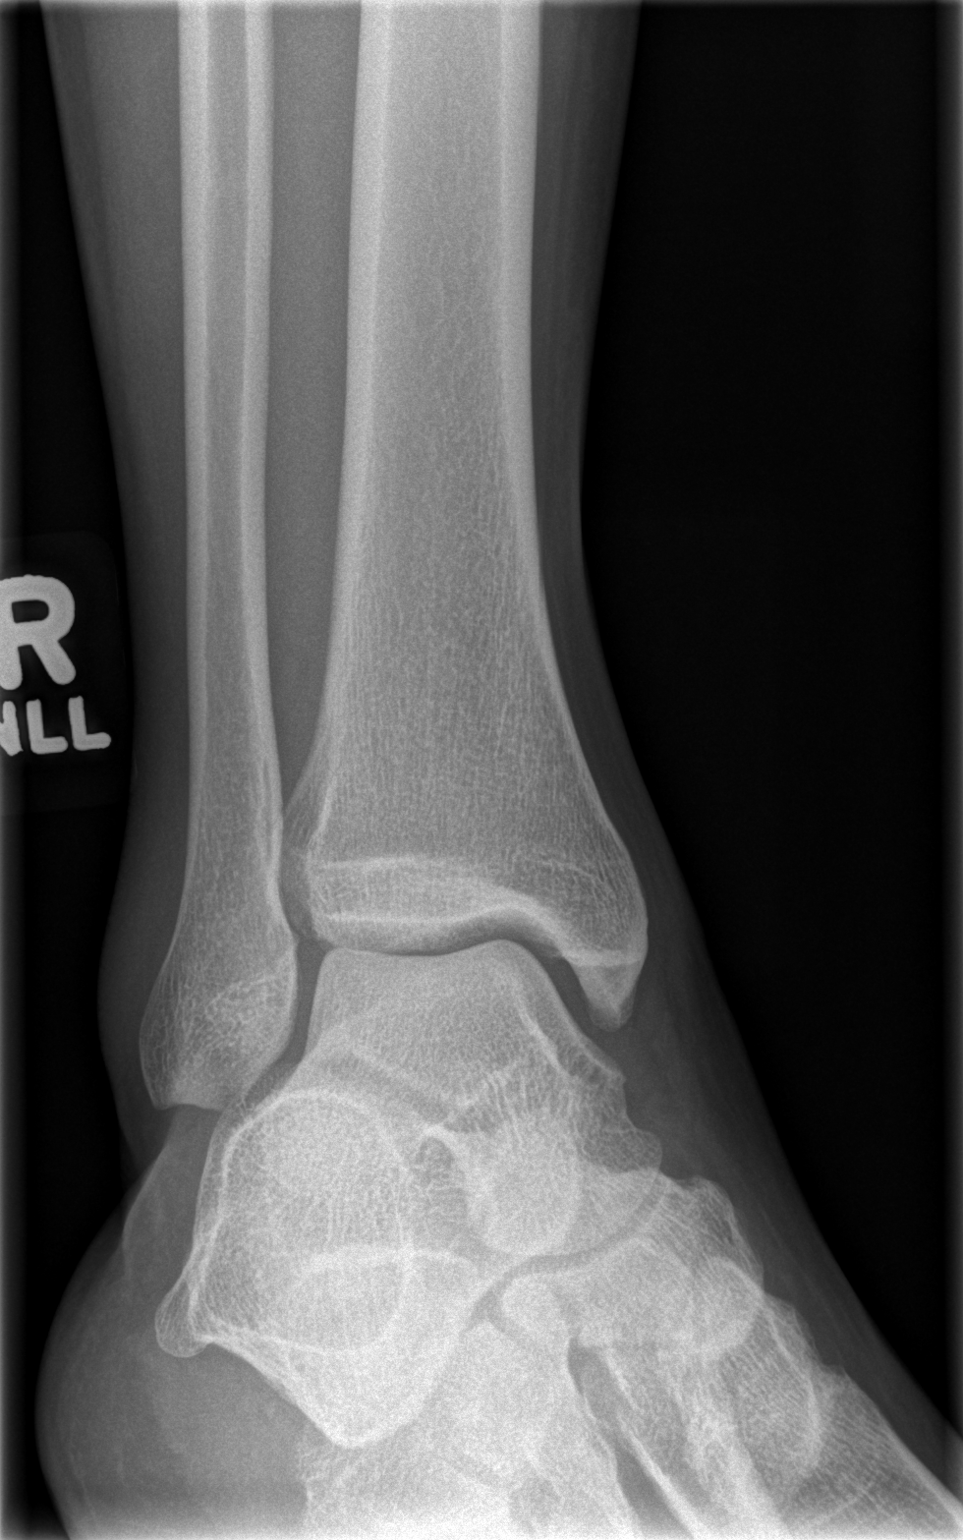

[t ankle joint lat right]
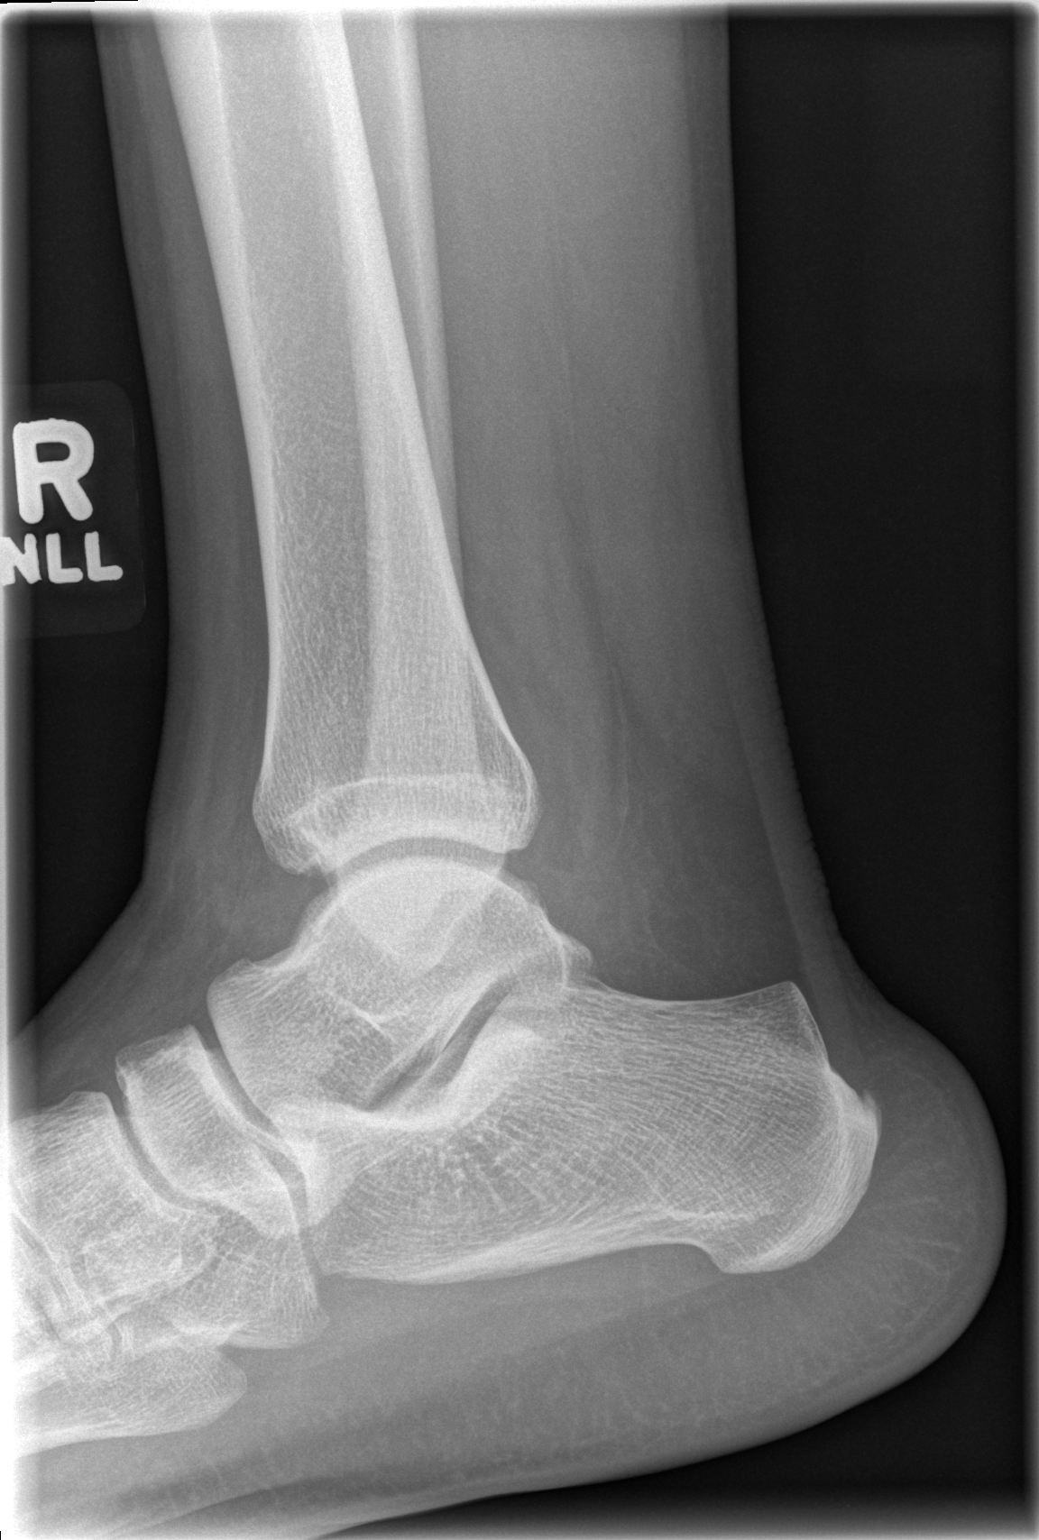

[3 of 3 positions shown; findings below may reference images not displayed]

FINDINGS: Ankle mortise intact.  The talar dome is normal.  No
malleolar fracture.  No joint effusion.
IMPRESSION: No ankle fracture.

## 2014-04-05 ENCOUNTER — Encounter (HOSPITAL_COMMUNITY): Payer: Self-pay | Admitting: Emergency Medicine

## 2014-04-05 ENCOUNTER — Emergency Department (HOSPITAL_COMMUNITY)
Admission: EM | Admit: 2014-04-05 | Discharge: 2014-04-05 | Disposition: A | Discharge status: Home / Self Care | Payer: Medicaid Other | Attending: Emergency Medicine | Admitting: Emergency Medicine

## 2014-04-05 DIAGNOSIS — Z792 Long term (current) use of antibiotics: Secondary | ICD-10-CM | POA: Diagnosis not present

## 2014-04-05 DIAGNOSIS — K029 Dental caries, unspecified: Secondary | ICD-10-CM | POA: Insufficient documentation

## 2014-04-05 DIAGNOSIS — K0889 Other specified disorders of teeth and supporting structures: Secondary | ICD-10-CM

## 2014-04-05 DIAGNOSIS — Z720 Tobacco use: Secondary | ICD-10-CM | POA: Insufficient documentation

## 2014-04-05 DIAGNOSIS — Z8669 Personal history of other diseases of the nervous system and sense organs: Secondary | ICD-10-CM | POA: Diagnosis not present

## 2014-04-05 DIAGNOSIS — K088 Other specified disorders of teeth and supporting structures: Secondary | ICD-10-CM | POA: Diagnosis present

## 2014-04-05 MED ORDER — OXYCODONE-ACETAMINOPHEN 5-325 MG PO TABS
2.0000 | ORAL_TABLET | Freq: Once | ORAL | Status: AC
  Administered 2014-04-05: 2 via ORAL
  Filled 2014-04-05: qty 2

## 2014-04-05 MED ORDER — OXYCODONE-ACETAMINOPHEN 5-325 MG PO TABS: 1.0000 | ORAL_TABLET | Freq: Four times a day (QID) | ORAL | Status: AC | PRN

## 2014-04-05 NOTE — ED Provider Notes (Signed)
CSN: [id number]     Arrival date & time 04/05/14  [id number] History   First MD Initiated Contact with Patient 04/05/14 [phone]     Chief Complaint  Patient presents with  . Dental Pain    (Consider location/radiation/quality/duration/timing/severity/associated sxs/prior Treatment) Patient is a 28 y.o. male presenting with tooth pain. The history is provided by the patient. No language interpreter was used.  Dental Pain Location:  Upper Upper teeth location:  2/RU 2nd molar Quality:  Sharp and burning Severity:  Moderate Onset quality:  Gradual Duration:  1 month Timing:  Intermittent Progression:  Waxing and waning Chronicity:  Recurrent Context: dental caries and poor dentition   Context: not abscess and not trauma   Previous work-up:  Dental exam (Patient saw his dentist today for exam; referred to oral surgeon) Relieved by:  Nothing Worsened by:  Cold food/drink, hot food/drink, touching and pressure Ineffective treatments:  NSAIDs Associated symptoms: no difficulty swallowing, no drooling, no facial swelling, no fever, no gum swelling, no oral bleeding, no oral lesions and no trismus   Risk factors: lack of dental care, periodontal disease and smoking     Past Medical History  Diagnosis Date  . Seizures   . Epilepsy    History reviewed. No pertinent past surgical history. History reviewed. No pertinent family history. History  Substance Use Topics  . Smoking status: Current Every Day Smoker  . Smokeless tobacco: Not on file  . Alcohol Use: No    Review of Systems  Constitutional: Negative for fever.  HENT: Positive for dental problem. Negative for drooling, facial swelling and mouth sores.   All other systems reviewed and are negative.   Allergies  Review of patient's allergies indicates no known allergies.  Home Medications   Prior to Admission medications   Medication Sig Start Date End Date Taking? Authorizing Provider  doxylamine, Sleep, (UNISOM) 25 MG tablet  Take 75 mg by mouth at bedtime as needed for sleep.   Yes Historical Provider, MD  ibuprofen (ADVIL,MOTRIN) 800 MG tablet Take 800 mg by mouth every 6 (six) hours as needed (for pain.).    Yes Historical Provider, MD  penicillin v potassium (VEETID) 500 MG tablet Take 500 mg by mouth 4 (four) times daily.   Yes Historical Provider, MD  oxyCODONE-acetaminophen (PERCOCET/ROXICET) 5-325 MG per tablet Take 1-2 tablets by mouth every 6 (six) hours as needed for moderate pain or severe pain. 04/05/14   Antony MaduraKelly Keiandra Sullenger, PA-C   BP 161/90 mmHg  Pulse 75  Temp(Src) 98.2 F (36.8 C) (Oral)  Resp 18  Ht 5\' 9"  (1.753 m)  Wt 170 lb (77.111 kg)  BMI 25.09 kg/m2  SpO2 100%   Physical Exam  Constitutional: He is oriented to person, place, and time. He appears well-developed and well-nourished. No distress.  HENT:  Head: Normocephalic and atraumatic.  Mouth/Throat: Uvula is midline, oropharynx is clear and moist and mucous membranes are normal. No trismus in the jaw. Abnormal dentition. Dental caries present. No dental abscesses or uvula swelling.    TTP to R upper dentition. Many teeth absent. No gingival fluctuance or trismus.  Eyes: Conjunctivae and EOM are normal. No scleral icterus.  Neck: Normal range of motion.  No nuchal rigidity or meningismus  Pulmonary/Chest: Effort normal. No respiratory distress.  Musculoskeletal: Normal range of motion.  Neurological: He is alert and oriented to person, place, and time. He exhibits normal muscle tone. Coordination normal.  Skin: Skin is warm and dry. No rash noted. He is not  diaphoretic. No erythema. No pallor.  Psychiatric: He has a normal mood and affect. His behavior is normal.  Nursing note and vitals reviewed.   ED Course  Procedures (including critical care time) Labs Review Labs Reviewed - No data to display  Imaging Review No results found.   EKG Interpretation None      MDM   Final diagnoses:  Dentalgia    Patient with toothache.  No gross abscess. Exam unconcerning for Ludwig's angina or spread of infection. Patient already on PCN and ibuprofen 800mg  TID. Will treat with pain medicine. Urged patient to follow-up with dentist. Referral and return precautions provided. Patient agreeable to plan with no unaddressed concerns.   Filed Vitals:   04/05/14 0258 04/05/14 0428  BP: 180/97 161/90  Pulse: 86 75  Temp: 97.9 F (36.6 C) 98.2 F (36.8 C)  TempSrc: Oral Oral  Resp: 16 18  Height: 5\' 9"  (1.753 m)   Weight: 170 lb (77.111 kg)   SpO2: 99% 100%       Antony MaduraKelly Ezrah Panning, PA-C 04/05/14 0441  Gerhard Munchobert Lockwood, MD 04/05/14 [phone]

## 2014-04-05 NOTE — ED Notes (Signed)
Pt reports that he was seen by a dentist yesterday, given Rx for Penicillin and 800mg  Ibuprofen for pain. Pt states that this has no relieved his pain. Pt to be scheduled for teeth removal, awaiting appointment for this.

## 2014-04-05 NOTE — Discharge Instructions (Signed)
Continue taking penicillin and ibuprofen as prescribed. You may take Percocet in addition to these medications for severe pain. You may also try ice or heat packs to your face to try and alleviate symptoms. Follow-up with your dentist and/or oral surgeon for further management of symptoms.  Dental Pain A tooth ache may be caused by cavities (tooth decay). Cavities expose the nerve of the tooth to air and hot or cold temperatures. It may come from an infection or abscess (also called a boil or furuncle) around your tooth. It is also often caused by dental caries (tooth decay). This causes the pain you are having. DIAGNOSIS  Your caregiver can diagnose this problem by exam. TREATMENT   If caused by an infection, it may be treated with medications which kill germs (antibiotics) and pain medications as prescribed by your caregiver. Take medications as directed.  Only take over-the-counter or prescription medicines for pain, discomfort, or fever as directed by your caregiver.  Whether the tooth ache today is caused by infection or dental disease, you should see your dentist as soon as possible for further care. SEEK MEDICAL CARE IF: The exam and treatment you received today has been provided on an emergency basis only. This is not a substitute for complete medical or dental care. If your problem worsens or new problems (symptoms) appear, and you are unable to meet with your dentist, call or return to this location. SEEK IMMEDIATE MEDICAL CARE IF:   You have a fever.  You develop redness and swelling of your face, jaw, or neck.  You are unable to open your mouth.  You have severe pain uncontrolled by pain medicine. MAKE SURE YOU:   Understand these instructions.  Will watch your condition.  Will get help right away if you are not doing well or get worse. Document Released: 03/23/2005 Document Revised: 06/15/2011 Document Reviewed: 11/09/2007 [facility] Patient Information 2015 [place]  [place]. This information is not intended to replace advice given to you by your health care provider. Make sure you discuss any questions you have with your health care provider.  Emergency Department Resource Guide 1) Find a Doctor and Pay Out of Pocket Although you won't have to find out who is covered by your insurance plan, it is a good idea to ask around and get recommendations. You will then need to call the office and see if the doctor you have chosen will accept you as a new patient and what types of options they offer for patients who are self-pay. Some doctors offer discounts or will set up payment plans for their patients who do not have insurance, but you will need to ask so you aren't surprised when you get to your appointment.  2) Contact Your Local Health Department Not all health departments have doctors that can see patients for sick visits, but many do, so it is worth a call to see if yours does. If you don't know where your local health department is, you can check in your phone book. The CDC also has a tool to help you locate your state's health department, and many state websites also have listings of all of their local health departments.  3) Find a Walk-in Clinic If your illness is not likely to be very severe or complicated, you may want to try a walk in clinic. These are popping up all over the country in pharmacies, drugstores, and shopping centers. They're usually staffed by nurse practitioners or physician assistants that have been trained to treat  common illnesses and complaints. They're usually fairly quick and inexpensive. However, if you have serious medical issues or chronic medical problems, these are probably not your best option.  No Primary Care Doctor: - Call Health Connect at  [phone] - they can help you locate a primary care doctor that  accepts your insurance, provides certain services, etc. - Physician Referral Service- [phone]  Chronic Pain  Problems: Organization         Address  Phone   Notes  [facility]  [phone] Patients need to be referred by their primary care doctor.   Medication Assistance: Organization         Address  Phone   Notes  [facility] Medication [facility] [address] [place], [place] [address] [phone] --Must be a resident of [facility] -- Must have NO insurance coverage whatsoever (no Medicaid/ Medicare, etc.) -- The pt. MUST have a primary care doctor that directs their care regularly and follows them in the community   MedAssist  [phone]   [employer]  [phone]    Agencies that provide inexpensive medical care: Organization         Address  Phone   Notes  [facility]  [phone]   [facility]    [phone]   [facility] [address] [place], [place] [address] [phone]   [facility] [address], [place] [phone]   [facility]    [phone]   [facility]    [phone]   [facility]  [address], [place] Phone:  [phone], Fax:  [phone] Hours of Operation:  9 am - 6 pm, M-F.  Also accepts Medicaid/Medicare and self-pay.  [facility]  [address], [place] Phone: [phone], Fax: [phone]. Hours of Operation:  8:30 am - 5:30 pm, M-F.  Also accepts Medicaid and self-pay.  [facility] [place] [address], [place] Phone: [phone]) [phone]   [facility] [address], [place], [place] [phone], Ext. [phone] Mondays & Thursdays: 7-9 AM.  First 15 patients are seen on a first come, first serve basis.    Medicaid-accepting [facility] Providers:  Organization         Address  Phone   Notes  [facility] [address], [place] [phone] Also  accepts self-pay patients.  [facility] [address], [place]  [phone]   [facility] [address], [place] [phone]) [phone]   [facility] [address] [place] [address], [place] [phone]   Renaye RakersVeita Bland [address], [place]   [phone] Only accepts [place] Access [place] patients after they have their name applied to their card.   Self-Pay (no insurance) in [facility] [place]:  Organization         Address  Phone   Notes  Sickle Cell Patients, [facility] [address], [place] [phone]   [facility] [address], [place] [phone]   [facility] [place]  [address], [place] [phone]   [facility]/Dr. Osei-Bonsu  [address] [place] [address], [place] or [phone], [address], [place] [phone] Phone number for both [facility] and [place] locations is the same.  [facility] [address], [place] [phone]   [facility] [place] [address] [place] [address], [place] or [address] [phone]   [facility] [address], [place] [phone], phone; [phone], fax Sees patients 1st and 3rd Saturday of every month.  Must not qualify for public or private insurance (i.e. Medicaid, Medicare, [place] Health Choice, Veterans' Benefits)  Household income should be no more than 200% of the poverty level The clinic cannot treat you if you are pregnant or think you are pregnant  Sexually transmitted diseases are not treated at the clinic.    Dental Care: Organization         Address  Phone  Notes  [facility] [address], [place] [phone] Accepts children up to age 30 who are enrolled in [place] or [place] Health Choice; pregnant  women with a Medicaid card; and children who have applied for Medicaid or [place] Health Choice, but were declined, whose parents can pay a reduced fee at time of service.  [facility] of [facility] [place]  [address], [place] [phone] Accepts children up to age 30 who are enrolled in [place] or [place] Health Choice; pregnant women with a Medicaid card; and children who have applied for Medicaid or [place] Health Choice, but were declined, whose parents can pay a reduced fee at time of service.  [facility]  [address], [place] [phone] Patients are seen by appointment only. Walk-ins are not accepted. [facility] will see patients 30 years of age and older. Monday - Tuesday (8am-5pm) Most Wednesdays (8:30-5pm) $30 per visit, cash only  [facility]  [address], [facility] [phone] Patients are seen by appointment only. Walk-ins are not accepted. [facility] will see patients 30 years of age and older. One Wednesday Evening (Monthly: Volunteer Based).  $30 per visit, cash only  [facility]  [phone] for adults; Children under age 74, call Graduate Pediatric Dentistry at [phone]. Children aged 824-14, please call [phone] to request a pediatric application.  Dental services are provided in all areas of dental care including fillings, crowns and bridges, complete and partial dentures, implants, gum treatment, root canals, and extractions. Preventive care is also provided. Treatment is provided to both adults and children. Patients are selected via a lottery and there is often a waiting list.   [facility] [address], [place]  [phone] [email or web address]   [facility] [address], [place], [place] [phone], Ext. [phone] Second and Fourth Thursday of each month, opens at 6:30 AM; Clinic ends at 9 AM.  Patients are  seen on a first-come first-served basis, and a limited number are seen during each clinic.   [facility]  [address], [place], [place] [phone]   Eligibility Requirements You must have lived in [place], [place], or [place] counties for at least the last three months.   You cannot be eligible for state or federal sponsored National Cityhealthcare insurance, including CIGNAVeterans Administration, [place]icaid, or [employer] EntertainmentMedicare.   You generally cannot be eligible for healthcare insurance through your employer.    How to apply: Eligibility screenings are held every Tuesday  and Wednesday afternoon from 1:00 pm until 4:00 pm. You do not need an appointment for the interview!  [facility] [address] [place], [place]   [place]  [facility]  [facility]  [phone]

## 2014-04-15 ENCOUNTER — Encounter (HOSPITAL_COMMUNITY): Payer: Self-pay

## 2014-04-15 ENCOUNTER — Emergency Department (HOSPITAL_COMMUNITY)
Admission: EM | Admit: 2014-04-15 | Discharge: 2014-04-15 | Disposition: A | Discharge status: Home / Self Care | Payer: Medicaid Other | Attending: Emergency Medicine | Admitting: Emergency Medicine

## 2014-04-15 DIAGNOSIS — K088 Other specified disorders of teeth and supporting structures: Secondary | ICD-10-CM | POA: Insufficient documentation

## 2014-04-15 DIAGNOSIS — Z8669 Personal history of other diseases of the nervous system and sense organs: Secondary | ICD-10-CM | POA: Diagnosis not present

## 2014-04-15 DIAGNOSIS — Z720 Tobacco use: Secondary | ICD-10-CM | POA: Insufficient documentation

## 2014-04-15 DIAGNOSIS — K029 Dental caries, unspecified: Secondary | ICD-10-CM | POA: Diagnosis not present

## 2014-04-15 DIAGNOSIS — K002 Abnormalities of size and form of teeth: Secondary | ICD-10-CM | POA: Insufficient documentation

## 2014-04-15 DIAGNOSIS — K0889 Other specified disorders of teeth and supporting structures: Secondary | ICD-10-CM

## 2014-04-15 MED ORDER — HYDROCODONE-ACETAMINOPHEN 5-325 MG PO TABS: 1.0000 | ORAL_TABLET | ORAL | Status: AC | PRN

## 2014-04-15 MED ORDER — HYDROCODONE-ACETAMINOPHEN 5-325 MG PO TABS
2.0000 | ORAL_TABLET | Freq: Once | ORAL | Status: AC
  Administered 2014-04-15: 2 via ORAL
  Filled 2014-04-15: qty 2

## 2014-04-15 MED ORDER — PENICILLIN V POTASSIUM 500 MG PO TABS: 500.0000 mg | ORAL_TABLET | Freq: Four times a day (QID) | ORAL | Status: AC

## 2014-04-15 NOTE — ED Notes (Signed)
Pt reports pain to the R upper mouth. Pt has missing teeth to R side and states that L upper tooth is causing the R side to hurt. Pt states that he has been to the dentist and is going to get teeth removed this month.

## 2014-04-15 NOTE — ED Provider Notes (Signed)
CSN: [id number]     Arrival date & time 04/15/14  1649 History  This chart was scribed for non-physician practitioner Sharilyn SitesLisa Estel Scholze, PA-C working with Rolland PorterMark James, MD by Littie Deedsichard Sun, ED Scribe. This patient was seen in room WTR5/WTR5 and the patient's care was started at 5:27 PM.      Chief Complaint  Patient presents with  . Dental Pain   The history is provided by the patient. No language interpreter was used.   HPI Comments: Javier SloopSean E Moore is a 29 y.o. male who presents to the Emergency Department complaining of throbbing right upper dental pain. He has missing teeth the the right side and states that a left upper tooth is causing the right side to hurt. Patient has tried Tylenol, Orajel and Aleve for his pain but without relief. He denies fever. Patient will visit [facility] on 1/26 to have his teeth removed. NKDA   Past Medical History  Diagnosis Date  . Seizures   . Epilepsy    History reviewed. No pertinent past surgical history. History reviewed. No pertinent family history. History  Substance Use Topics  . Smoking status: Current Every Day Smoker  . Smokeless tobacco: Not on file  . Alcohol Use: No    Review of Systems  HENT: Positive for dental problem.   All other systems reviewed and are negative.     Allergies  Review of patient's allergies indicates no known allergies.  Home Medications   Prior to Admission medications   Medication Sig Start Date End Date Taking? Authorizing Provider  doxylamine, Sleep, (UNISOM) 25 MG tablet Take 75 mg by mouth at bedtime as needed for sleep.    Historical Provider, MD  ibuprofen (ADVIL,MOTRIN) 800 MG tablet Take 800 mg by mouth every 6 (six) hours as needed (for pain.).     Historical Provider, MD  oxyCODONE-acetaminophen (PERCOCET/ROXICET) 5-325 MG per tablet Take 1-2 tablets by mouth every 6 (six) hours as needed for moderate pain or severe pain. 04/05/14   Antony MaduraKelly Humes, PA-C  penicillin v potassium (VEETID) 500 MG  tablet Take 500 mg by mouth 4 (four) times daily.    Historical Provider, MD   BP 155/97 mmHg  Pulse 72  Temp(Src) 98.5 F (36.9 C) (Oral)  Resp 18  Ht 5\' 10"  (1.778 m)  Wt 190 lb (86.183 kg)  BMI 27.26 kg/m2  SpO2 99%   Physical Exam  Constitutional: He is oriented to person, place, and time. He appears well-developed and well-nourished.  HENT:  Head: Normocephalic and atraumatic.  Mouth/Throat: Uvula is midline, oropharynx is clear and moist and mucous membranes are normal. Abnormal dentition. Dental caries present. No oropharyngeal exudate, posterior oropharyngeal edema, posterior oropharyngeal erythema or tonsillar abscesses.  Multiple teeth missing, present teeth are in extremely poor dentition, right upper molar broken with large cavity, surrounding gingiva swollen and discolored, handling secretions appropriately, no trismus  Eyes: Conjunctivae and EOM are normal. Pupils are equal, round, and reactive to light.  Neck: Normal range of motion. Neck supple.  Cardiovascular: Normal rate, regular rhythm and normal heart sounds.   Pulmonary/Chest: Effort normal and breath sounds normal. No respiratory distress. He has no wheezes.  Musculoskeletal: Normal range of motion.  Neurological: He is alert and oriented to person, place, and time.  Skin: Skin is warm and dry.  Psychiatric: He has a normal mood and affect.  Nursing note and vitals reviewed.   ED Course  Procedures  DIAGNOSTIC STUDIES: Oxygen Saturation is 99% on room air,  normal by my interpretation.    COORDINATION OF CARE: 5:31 PM-Discussed treatment plan which includes abx with pt at bedside and pt agreed to plan.    Labs Review Labs Reviewed - No data to display  Imaging Review No results found.   EKG Interpretation None      MDM   Final diagnoses:  Pain, dental   Dental pain with possibly developing infection.  Patient afebrile, non-toxic in appearance with stable VS.  Will start on abx and pain meds.   Recommended FU with dentist as scheduled.  Discussed plan with patient, he/she acknowledged understanding and agreed with plan of care.  Return precautions given for new or worsening symptoms.  I personally performed the services described in this documentation, which was scribed in my presence. The recorded information has been reviewed and is accurate.  Garlon Hatchet, PA-C 04/15/14 1906  Rolland Porter, MD 04/24/14 [phone]

## 2014-04-15 NOTE — Discharge Instructions (Signed)
Take the prescribed medication as directed. Follow-up with your dentist as scheduled. Return to the ED for new or worsening symptoms. 

## 2015-01-28 ENCOUNTER — Emergency Department (INDEPENDENT_AMBULATORY_CARE_PROVIDER_SITE_OTHER)
Admission: EM | Admit: 2015-01-28 | Discharge: 2015-01-28 | Disposition: A | Discharge status: Home / Self Care | Payer: 59 | Source: Home / Self Care | Attending: Family Medicine | Admitting: Family Medicine

## 2015-01-28 ENCOUNTER — Encounter (HOSPITAL_COMMUNITY): Payer: Self-pay | Admitting: Emergency Medicine

## 2015-01-28 DIAGNOSIS — J020 Streptococcal pharyngitis: Secondary | ICD-10-CM | POA: Diagnosis not present

## 2015-01-28 LAB — POCT RAPID STREP A: Streptococcus, Group A Screen (Direct): POSITIVE — AB

## 2015-01-28 MED ORDER — AMOXICILLIN 875 MG PO TABS: 875.0000 mg | ORAL_TABLET | Freq: Two times a day (BID) | ORAL | Status: AC

## 2015-01-28 MED ORDER — KETOROLAC TROMETHAMINE 60 MG/2ML IM SOLN
INTRAMUSCULAR | Status: AC
  Filled 2015-01-28: qty 2

## 2015-01-28 MED ORDER — KETOROLAC TROMETHAMINE 60 MG/2ML IM SOLN
60.0000 mg | Freq: Once | INTRAMUSCULAR | Status: AC
  Administered 2015-01-28: 60 mg via INTRAMUSCULAR

## 2015-01-28 NOTE — ED Provider Notes (Signed)
CSN: [id number]     Arrival date & time 01/28/15  1425 History   First MD Initiated Contact with Patient 01/28/15 1558     No chief complaint on file.  (Consider location/radiation/quality/duration/timing/severity/associated sxs/prior Treatment) The history is provided by the patient.    Past Medical History  Diagnosis Date  . Seizures   . Epilepsy    No past surgical history on file. No family history on file. Social History  Substance Use Topics  . Smoking status: Current Every Day Smoker  . Smokeless tobacco: Not on file  . Alcohol Use: No    Review of Systems  Constitutional: Negative.   HENT: Positive for sore throat.   Eyes: Negative.   Respiratory: Negative.   Cardiovascular: Negative.   Gastrointestinal: Negative.   Endocrine: Negative.   Genitourinary: Negative.   Allergic/Immunologic: Negative.   Neurological: Negative.     Allergies  Review of patient's allergies indicates no known allergies.  Home Medications   Prior to Admission medications   Medication Sig Start Date End Date Taking? Authorizing Provider  doxylamine, Sleep, (UNISOM) 25 MG tablet Take 75 mg by mouth at bedtime as needed for sleep.    Historical Provider, MD  HYDROcodone-acetaminophen (NORCO/VICODIN) 5-325 MG per tablet Take 1 tablet by mouth every 4 (four) hours as needed. 04/15/14   Garlon HatchetLisa M Sanders, PA-C  ibuprofen (ADVIL,MOTRIN) 800 MG tablet Take 800 mg by mouth every 6 (six) hours as needed (for pain.).     Historical Provider, MD  oxyCODONE-acetaminophen (PERCOCET/ROXICET) 5-325 MG per tablet Take 1-2 tablets by mouth every 6 (six) hours as needed for moderate pain or severe pain. 04/05/14   Antony MaduraKelly Humes, PA-C   Meds Ordered and Administered this Visit  Medications - No data to display  BP 149/98 mmHg  Pulse 63  Temp(Src) 98.7 F (37.1 C) (Oral)  Resp 16  SpO2 100% No data found.   Physical Exam  Constitutional: He appears well-developed and well-nourished.  HENT:  Head:  Normocephalic and atraumatic.  Mouth/Throat: Oropharyngeal exudate present.  Tonsils 3 plus and injected bilateral.  Eyes: Conjunctivae and EOM are normal. Pupils are equal, round, and reactive to light.  Neck: Normal range of motion. Neck supple.  Cardiovascular: Normal rate, regular rhythm and normal heart sounds.   Pulmonary/Chest: Breath sounds normal.  Abdominal: Soft. Bowel sounds are normal.  Lymphadenopathy:    He has cervical adenopathy.    ED Course  Procedures (including critical care time)  Labs Review Labs Reviewed  POCT RAPID STREP A - Abnormal; Notable for the following:    Streptococcus, Group A Screen (Direct) POSITIVE (*)    All other components within normal limits    Imaging Review No results found.   Visual Acuity Review  Right Eye Distance:   Left Eye Distance:   Bilateral Distance:    Right Eye Near:   Left Eye Near:    Bilateral Near:         MDM  GABS pharyngitis  Amoxicillin 875mg  po bid x 10 days toradol 60mg  IM Work excuse WSWG's, tylenol and motrin otc prn as directed.  Anselm PancoastWilliam J Iyan Flett FNP    Deatra CanterWilliam J Kynzli Rease, FNP 01/28/15 [phone]

## 2015-01-28 NOTE — ED Notes (Signed)
Pt complains of swelling on the right side of his throat/neck starting this morning.  He states he is unable to swallow without a sharp pain and is unable to eat.  He did have a cold prior to this, but denies any fever.

## 2015-08-24 IMAGING — CR DG FOOT COMPLETE 3+V*L*
3 series · 3 of 3 positions shown · non-contrast
Comparison: None.

CLINICAL DATA: Obvious foot deformity. Crush injury to the foot.
Load of bricks fell on patient's foot.

EXAM:
LEFT FOOT - COMPLETE 3+ VIEW

[x foot ap left]
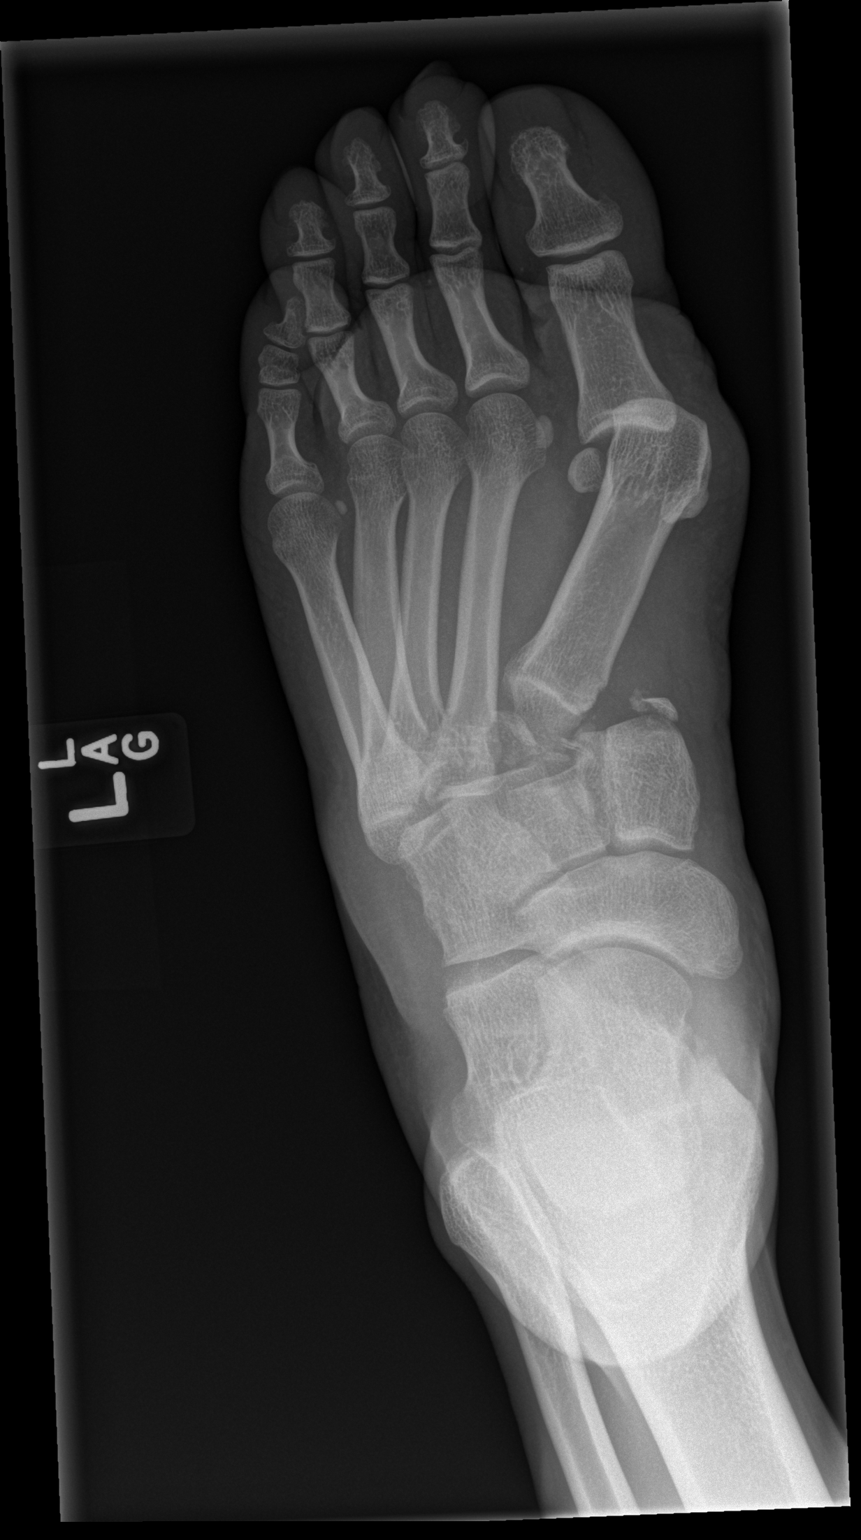

[x foot obl left]
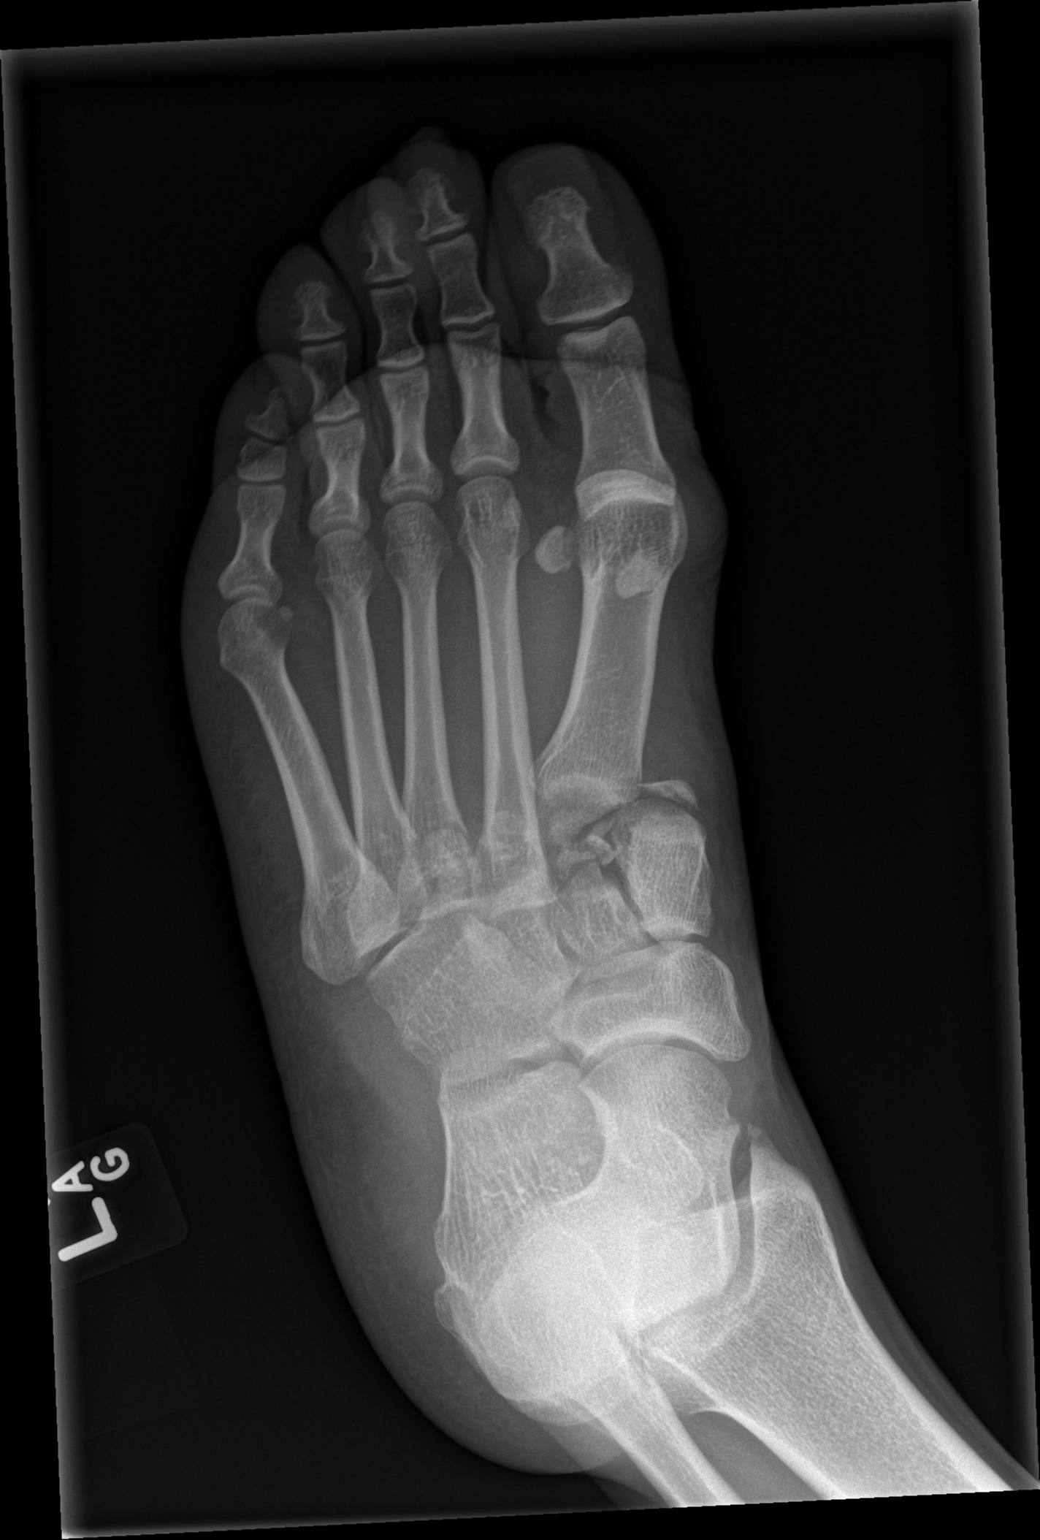

[x foot lat left]
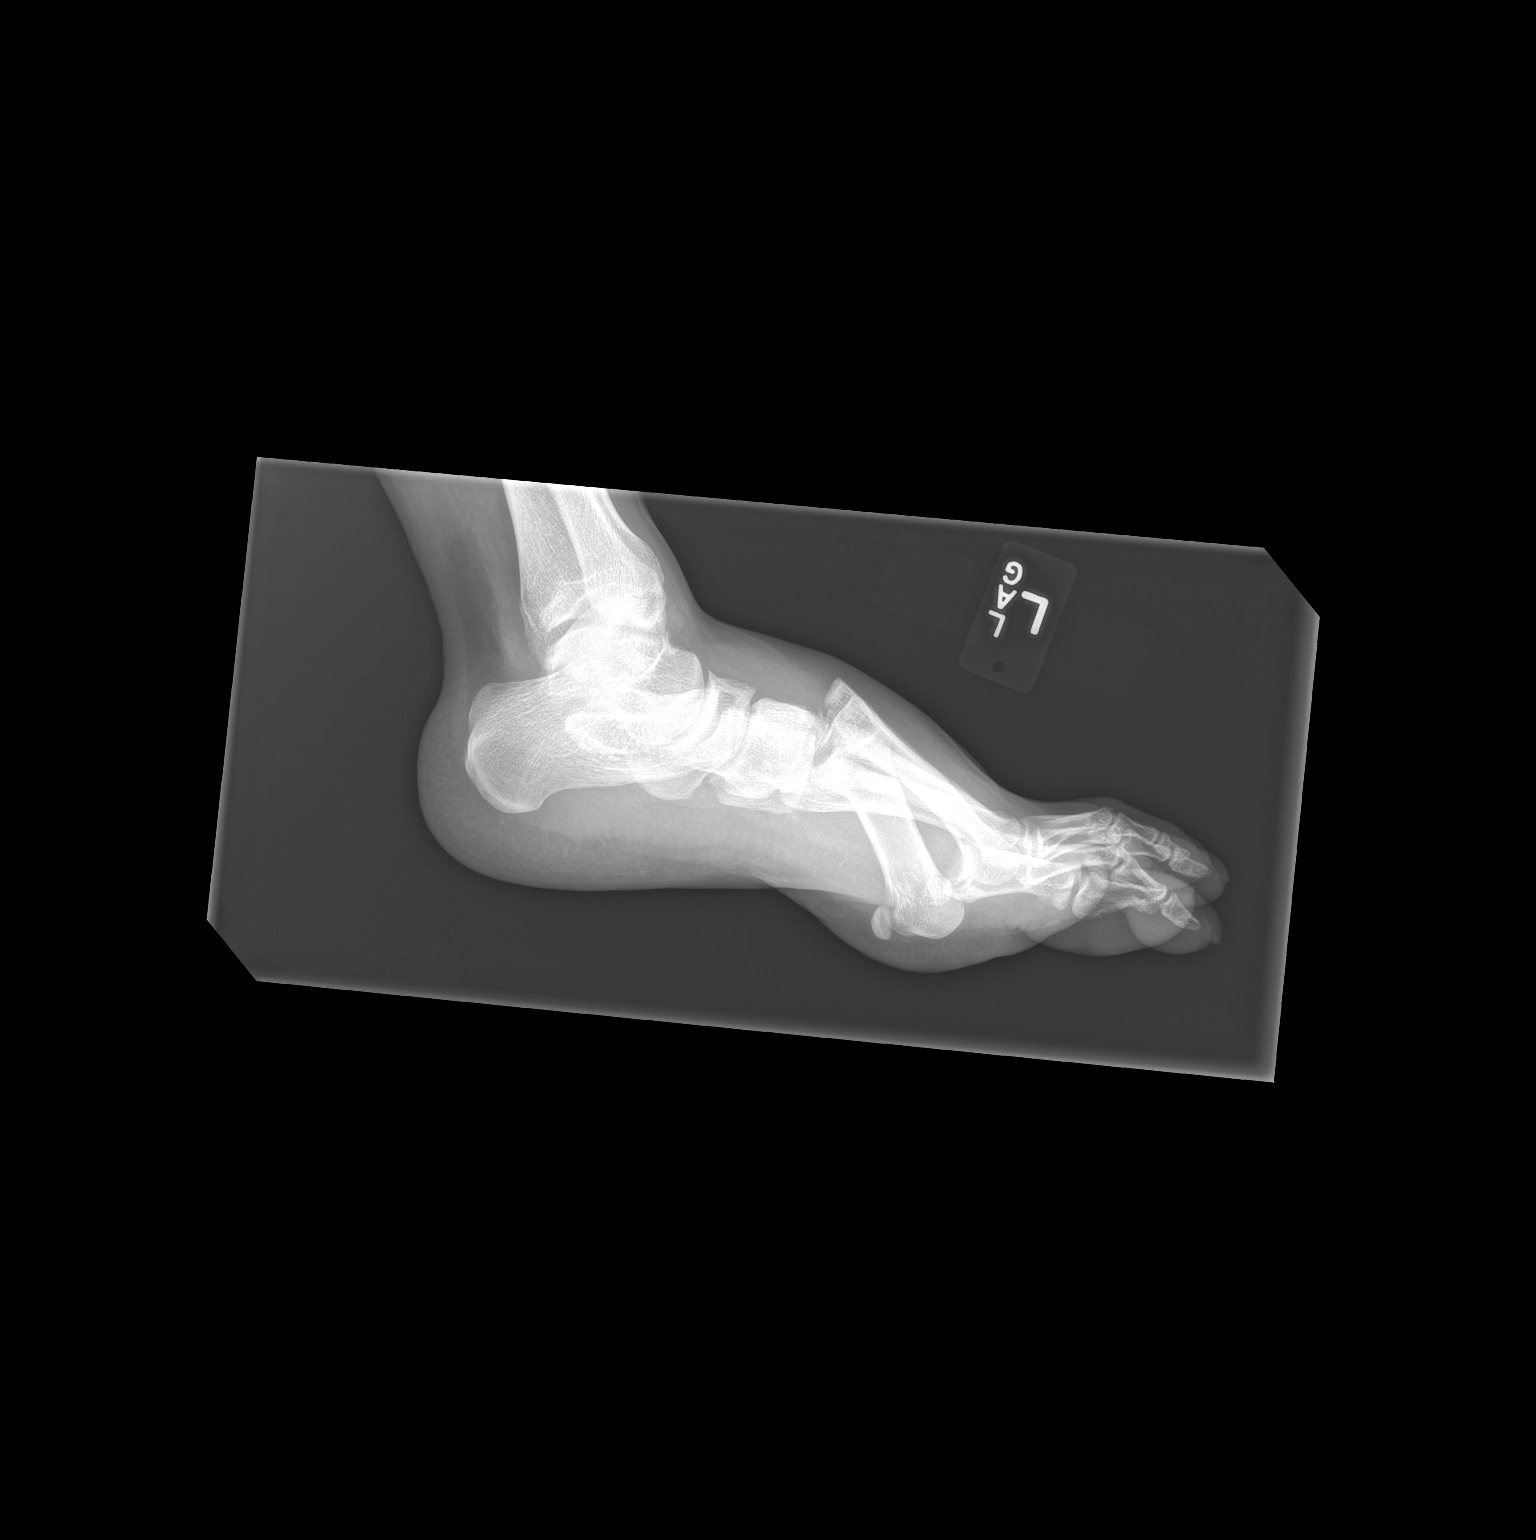

[3 of 3 positions shown; findings below may reference images not displayed]

FINDINGS: Fracture dislocation at the tarsometatarsal junction is present.
There is dorsal dislocation of the second through fourth metatarsals
with fracture of the plantar aspect of the metatarsal bases. There
is lateral dislocation of the first metatarsal base, now 1 line with
the middle cuneiform. A fragment of the first metatarsal base
remains approximated to the medial cuneiform. Complete disruption of
the Lisfranc joint. There is also plantar dislocation of the first
metatarsal head with shortening of the first toe. Calcaneus and
subtalar joints appear intact. There is distraction of the
calcaneocuboid joint. Talonavicular joint remains approximated.
Phalanges and metatarsal shafts appear intact. There are almost
certainly fractures of the cuneiform bones at the tarsometatarsal
junction.
IMPRESSION: 1. Complex midfoot fracture dislocation with lateral and dorsal
dislocation of the second through fourth metatarsal bases.
2. Plantar dislocation of the first MT head.

## 2016-01-04 ENCOUNTER — Encounter (HOSPITAL_COMMUNITY): Payer: Self-pay

## 2016-01-04 ENCOUNTER — Emergency Department (HOSPITAL_COMMUNITY)
Admission: EM | Admit: 2016-01-04 | Discharge: 2016-01-04 | Disposition: A | Discharge status: Home / Self Care | Payer: 59 | Attending: Emergency Medicine | Admitting: Emergency Medicine

## 2016-01-04 DIAGNOSIS — W25XXXA Contact with sharp glass, initial encounter: Secondary | ICD-10-CM | POA: Diagnosis not present

## 2016-01-04 DIAGNOSIS — Y999 Unspecified external cause status: Secondary | ICD-10-CM | POA: Insufficient documentation

## 2016-01-04 DIAGNOSIS — S61412A Laceration without foreign body of left hand, initial encounter: Secondary | ICD-10-CM | POA: Diagnosis not present

## 2016-01-04 DIAGNOSIS — Y939 Activity, unspecified: Secondary | ICD-10-CM | POA: Diagnosis not present

## 2016-01-04 DIAGNOSIS — Y929 Unspecified place or not applicable: Secondary | ICD-10-CM | POA: Diagnosis not present

## 2016-01-04 DIAGNOSIS — Z23 Encounter for immunization: Secondary | ICD-10-CM | POA: Insufficient documentation

## 2016-01-04 DIAGNOSIS — F17210 Nicotine dependence, cigarettes, uncomplicated: Secondary | ICD-10-CM | POA: Diagnosis not present

## 2016-01-04 MED ORDER — TETANUS-DIPHTH-ACELL PERTUSSIS 5-2.5-18.5 LF-MCG/0.5 IM SUSP
0.5000 mL | Freq: Once | INTRAMUSCULAR | Status: AC
  Administered 2016-01-04: 0.5 mL via INTRAMUSCULAR
  Filled 2016-01-04: qty 0.5

## 2016-01-04 MED ORDER — IBUPROFEN 800 MG PO TABS: 800.0000 mg | ORAL_TABLET | Freq: Four times a day (QID) | ORAL | 0 refills | Status: AC | PRN

## 2016-01-04 MED ORDER — LIDOCAINE HCL (PF) 1 % IJ SOLN
5.0000 mL | Freq: Once | INTRAMUSCULAR | Status: AC
  Administered 2016-01-04: 5 mL
  Filled 2016-01-04: qty 5

## 2016-01-04 NOTE — ED Triage Notes (Signed)
Pt was moving glass, glass broke, lac to right palmar side of hand, near thumb.  Bleeding controlled.

## 2016-01-04 NOTE — Discharge Instructions (Signed)
Please read and follow all provided instructions.  Your diagnoses today include:  1. Laceration of hand, left, initial encounter     Tests performed today include: Vital signs. See below for your results today.   Medications prescribed:   Take any prescribed medications only as directed.   Home care instructions:  Follow any educational materials and wound care instructions contained in this packet.   You may shower and wash the area with soap and water, just be sure to pat the area dry and not rub over the stitches. Do no put your stiches underwater (in a bath, pool, or lake). Getting stiches wet can slow down healing and increase your chances of getting an infection. You may apply Bacitracin or Neosporin twice a day for 7 days, and keep the ara clean with  bandage or gauze. Do not apply alcohol or hydrogen peroxide. Cover the area if it draining or weeping.   Follow-up instructions: Suture Removal: Return to the Emergency Department or see your primary care care doctor in 8-10 days for a recheck of your wound and removal of your sutures or staples.    Return instructions:  Return to the Emergency Department if you have: Fever Worsening pain Worsening swelling of the wound Pus draining from the wound Redness of the skin that moves away from the wound, especially if it streaks away from the affected area  Any other emergent concerns  Your vital signs today were: BP 142/97 (BP Location: Right Arm)    Pulse 74    Temp 98.5 F (36.9 C) (Oral)    Resp 16    Ht 5\' 10"  (1.778 m)    Wt 68.7 kg    SpO2 98%    BMI 21.75 kg/m  If your blood pressure (BP) was elevated above 135/85 this visit, please have this repeated by your doctor within one month. --------------

## 2016-01-04 NOTE — ED Provider Notes (Signed)
[facility]-EMERGENCY DEPT Provider Note   CSN: [id number] Arrival date & time: 01/04/16  1532  By signing my name below, I, Alyssa GroveMartin Green, attest that this documentation has been prepared under the direction and in the presence of Audry Piliyler Talitha Dicarlo, PA-C. Electronically Signed: Alyssa GroveMartin Green, ED Scribe. 01/04/16. 3:46 PM.  History   Chief Complaint Chief Complaint  Patient presents with  . Laceration   The history is provided by the patient. No language interpreter was used.   HPI Comments: Javier Moore is a 30 y.o. male who presents to the Emergency Department complaining of a laceration to the left hand approximately 30 min PTA. Pt is currently no on blood thinners. Pt is unaware if Tetanus is UTD. Pt reports he was moving glass when a glass top cracked and cut his hand. Bleeding is controlled with a pressure bandage.  Past Medical History:  Diagnosis Date  . Epilepsy (HCC)   . Seizures (HCC)     There are no active problems to display for this patient.   History reviewed. No pertinent surgical history.   Home Medications    Prior to Admission medications   Medication Sig Start Date End Date Taking? Authorizing Provider  amoxicillin (AMOXIL) 875 MG tablet Take 1 tablet (875 mg total) by mouth 2 (two) times daily. 01/28/15   Deatra CanterWilliam J Oxford, FNP  doxylamine, Sleep, (UNISOM) 25 MG tablet Take 75 mg by mouth at bedtime as needed for sleep.    Historical Provider, MD  HYDROcodone-acetaminophen (NORCO/VICODIN) 5-325 MG per tablet Take 1 tablet by mouth every 4 (four) hours as needed. 04/15/14   Garlon HatchetLisa M Sanders, PA-C  ibuprofen (ADVIL,MOTRIN) 800 MG tablet Take 800 mg by mouth every 6 (six) hours as needed (for pain.).     Historical Provider, MD  oxyCODONE-acetaminophen (PERCOCET/ROXICET) 5-325 MG per tablet Take 1-2 tablets by mouth every 6 (six) hours as needed for moderate pain or severe pain. 04/05/14   Antony MaduraKelly Humes, PA-C    Family History History reviewed. No pertinent family  history.  Social History Social History  Substance Use Topics  . Smoking status: Current Every Day Smoker    Packs/day: 0.50    Types: Cigarettes  . Smokeless tobacco: Never Used  . Alcohol use No     Allergies   Review of patient's allergies indicates no known allergies.   Review of Systems Review of Systems  Constitutional: Negative for fever.  Skin: Positive for wound.  All other systems reviewed and are negative.    Physical Exam Updated Vital Signs BP 142/97 (BP Location: Right Arm)   Pulse 74   Temp 98.5 F (36.9 C) (Oral)   Resp 16   Ht 5\' 10"  (1.778 m)   Wt 151 lb 9 oz (68.7 kg)   SpO2 98%   BMI 21.75 kg/m   Physical Exam  Constitutional: He appears well-developed and well-nourished.  HENT:  Head: Normocephalic.  Eyes: Conjunctivae are normal.  Cardiovascular: Normal rate.   Pulmonary/Chest: Effort normal. No respiratory distress.  Abdominal: He exhibits no distension.  Musculoskeletal: Normal range of motion.  Neurological: He is alert.  Skin: Skin is warm and dry.  1.5 cm linear laceration palmar aspect on the greater thenar eminence of the left hand  Bleeding controlled. No glass visualized. ROM intact. NVI. Cap refill <2sec. No signs of infection.   Psychiatric: He has a normal mood and affect. His behavior is normal.  Nursing note and vitals reviewed.  ED Treatments / Results  DIAGNOSTIC STUDIES: Oxygen  Saturation is 98% on RA, normal by my interpretation.    COORDINATION OF CARE: 3:50 PM Discussed treatment plan with pt at bedside which includes laceration repair and pt agreed to plan.  Labs (all labs ordered are listed, but only abnormal results are displayed) Labs Reviewed - No data to display  EKG  EKG Interpretation None      Radiology No results found.  Procedures .Marland KitchenLaceration Repair Date/Time: 01/04/2016 3:53 PM Performed by: Audry Pili Authorized by: Audry Pili   Consent:    Consent obtained:  Verbal   Consent  given by:  Patient   Risks discussed:  Pain Anesthesia (see MAR for exact dosages):    Anesthesia method:  Local infiltration Laceration details:    Location:  Hand   Hand location:  L palm   Length (cm):  1.5 Repair type:    Repair type:  Simple Pre-procedure details:    Preparation:  Patient was prepped and draped in usual sterile fashion Exploration:    Wound extent: no foreign bodies/material noted     Contaminated: no   Treatment:    Area cleansed with:  Shur-Clens   Amount of cleaning:  Standard   Irrigation method:  Syringe   Visualized foreign bodies/material removed: no   Skin repair:    Repair method:  Sutures   Suture size:  4-0   Suture material:  Prolene   Suture technique:  Simple interrupted   Number of sutures:  3 Approximation:    Approximation:  Close   Vermilion border: well-aligned   Post-procedure details:    Patient tolerance of procedure:  Tolerated well, no immediate complications    (including critical care time)  Medications Ordered in ED Medications - No data to display   Initial Impression / Assessment and Plan / ED Course  I have reviewed the triage vital signs and the nursing notes.  Pertinent labs & imaging results that were available during my care of the patient were reviewed by me and considered in my medical decision making (see chart for details).  Clinical Course   I have reviewed the relevant previous healthcare records.I obtained HPI from historian.  ED Course:  Assessment:  Patient is a 30yM that presents with laceration to left palm on greater thenar eminence. Tdap booster given. Pressure irrigation performed. Bottom of the wound visualized with bleeding controled. Laceration occurred < 8 hours prior to repair which was well tolerated. Irrigated copiously with NS. No glass visualized. Pt has no co morbidities to effect normal wound healing. Discussed suture home care w pt and answered questions. Pt to f-u for wound check and  suture removal in 8-10 days. Pt is hemodynamically stable w no complaints prior to dc.    Disposition/Plan:  DC Home Additional Verbal discharge instructions given and discussed with patient.  Pt Instructed to f/u with PCP in the next week for evaluation and treatment of symptoms. Return precautions given Pt acknowledges and agrees with plan  Supervising Physician No att. providers found   Final Clinical Impressions(s) / ED Diagnoses   Final diagnoses:  Laceration of hand, left, initial encounter    New Prescriptions New Prescriptions   No medications on file  I personally performed the services described in this documentation, which was scribed in my presence. The recorded information has been reviewed and is accurate.    Audry Pili, PA-C 01/04/16 1616    Melene Plan, DO 01/04/16 2307

## 2016-01-04 NOTE — ED Notes (Signed)
Declined W/C at D/C and was escorted to lobby by RN. 

## 2018-06-06 ENCOUNTER — Encounter (HOSPITAL_COMMUNITY): Payer: Self-pay | Admitting: Emergency Medicine

## 2018-06-06 ENCOUNTER — Other Ambulatory Visit: Payer: Self-pay

## 2018-06-06 ENCOUNTER — Emergency Department (HOSPITAL_COMMUNITY)
Admission: EM | Admit: 2018-06-06 | Discharge: 2018-06-07 | Disposition: A | Discharge status: Home / Self Care | Payer: BLUE CROSS/BLUE SHIELD | Attending: Emergency Medicine | Admitting: Emergency Medicine

## 2018-06-06 DIAGNOSIS — R1013 Epigastric pain: Secondary | ICD-10-CM | POA: Diagnosis not present

## 2018-06-06 DIAGNOSIS — K219 Gastro-esophageal reflux disease without esophagitis: Secondary | ICD-10-CM | POA: Diagnosis not present

## 2018-06-06 DIAGNOSIS — R1012 Left upper quadrant pain: Secondary | ICD-10-CM | POA: Diagnosis present

## 2018-06-06 DIAGNOSIS — K5900 Constipation, unspecified: Secondary | ICD-10-CM | POA: Diagnosis not present

## 2018-06-06 LAB — URINALYSIS, ROUTINE W REFLEX MICROSCOPIC
BILIRUBIN URINE: NEGATIVE
Bacteria, UA: NONE SEEN
Glucose, UA: NEGATIVE mg/dL
Hgb urine dipstick: NEGATIVE
Ketones, ur: 5 mg/dL — AB
Nitrite: NEGATIVE
PROTEIN: NEGATIVE mg/dL
Specific Gravity, Urine: 1.035 — ABNORMAL HIGH (ref 1.005–1.030)
pH: 5 (ref 5.0–8.0)

## 2018-06-06 LAB — COMPREHENSIVE METABOLIC PANEL
ALT: 15 U/L (ref 0–44)
AST: 21 U/L (ref 15–41)
Albumin: 4 g/dL (ref 3.5–5.0)
Alkaline Phosphatase: 59 U/L (ref 38–126)
Anion gap: 8 (ref 5–15)
BUN: 12 mg/dL (ref 6–20)
CO2: 26 mmol/L (ref 22–32)
Calcium: 9.1 mg/dL (ref 8.9–10.3)
Chloride: 104 mmol/L (ref 98–111)
Creatinine, Ser: 1.44 mg/dL — ABNORMAL HIGH (ref 0.61–1.24)
GFR calc Af Amer: >60 mL/min (ref >60)
GFR calc non Af Amer: >60 mL/min (ref >60)
Glucose, Bld: 106 mg/dL — ABNORMAL HIGH (ref 70–99)
POTASSIUM: 4.3 mmol/L (ref 3.5–5.1)
SODIUM: 138 mmol/L (ref 135–145)
Total Bilirubin: 0.3 mg/dL (ref 0.3–1.2)
Total Protein: 7.4 g/dL (ref 6.5–8.1)

## 2018-06-06 LAB — CBC
HEMATOCRIT: 47.1 % (ref 39.0–52.0)
Hemoglobin: 15.3 g/dL (ref 13.0–17.0)
MCH: 29.7 pg (ref 26.0–34.0)
MCHC: 32.5 g/dL (ref 30.0–36.0)
MCV: 91.3 fL (ref 80.0–100.0)
Platelets: 172 10*3/uL (ref 150–400)
RBC: 5.16 MIL/uL (ref 4.22–5.81)
RDW: 13.2 % (ref 11.5–15.5)
WBC: 6 10*3/uL (ref 4.0–10.5)
nRBC: 0 % (ref 0.0–0.2)

## 2018-06-06 LAB — LIPASE, BLOOD: Lipase: 53 U/L — ABNORMAL HIGH (ref 11–51)

## 2018-06-06 MED ORDER — SODIUM CHLORIDE 0.9% FLUSH: 3.0000 mL | Freq: Once | INTRAVENOUS | Status: DC

## 2018-06-06 NOTE — ED Triage Notes (Signed)
Patient with abdominal pain that started on Friday.  No nausea or vomiting.  He states that the pain keeps him up, he is unable to sleep due to the pain.  He states that it is in his upper quadrants.

## 2018-06-07 ENCOUNTER — Other Ambulatory Visit: Payer: Self-pay

## 2018-06-07 ENCOUNTER — Encounter (HOSPITAL_COMMUNITY): Payer: Self-pay | Admitting: Emergency Medicine

## 2018-06-07 ENCOUNTER — Emergency Department (HOSPITAL_COMMUNITY): Payer: BLUE CROSS/BLUE SHIELD

## 2018-06-07 MED ORDER — DICYCLOMINE HCL 10 MG/ML IM SOLN
20.0000 mg | Freq: Once | INTRAMUSCULAR | Status: AC
  Administered 2018-06-07: 20 mg via INTRAMUSCULAR
  Filled 2018-06-07: qty 2

## 2018-06-07 MED ORDER — SIMETHICONE 80 MG PO CHEW
40.0000 mg | CHEWABLE_TABLET | Freq: Once | ORAL | Status: AC
  Administered 2018-06-07: 40 mg via ORAL
  Filled 2018-06-07: qty 1

## 2018-06-07 MED ORDER — OMEPRAZOLE 20 MG PO CPDR: 20.0000 mg | DELAYED_RELEASE_CAPSULE | Freq: Every day | ORAL | 0 refills | Status: AC

## 2018-06-07 MED ORDER — POLYETHYLENE GLYCOL 3350 17 G PO PACK: 17.0000 g | PACK | Freq: Every day | ORAL | 0 refills | Status: AC

## 2018-06-07 MED ORDER — LIDOCAINE VISCOUS HCL 2 % MT SOLN
15.0000 mL | Freq: Once | OROMUCOSAL | Status: AC
  Administered 2018-06-07: 15 mL via ORAL
  Filled 2018-06-07: qty 15

## 2018-06-07 MED ORDER — ALUM & MAG HYDROXIDE-SIMETH 200-200-20 MG/5ML PO SUSP
30.0000 mL | Freq: Once | ORAL | Status: AC
  Administered 2018-06-07: 30 mL via ORAL
  Filled 2018-06-07: qty 30

## 2018-06-07 NOTE — ED Notes (Signed)
Vital signs stable. 

## 2018-06-07 NOTE — ED Notes (Signed)
Family at bedside. 

## 2018-06-07 NOTE — ED Provider Notes (Signed)
[facility] EMERGENCY DEPARTMENT Provider Note   CSN: [id number] Arrival date & time: 06/06/18  2029    History   Chief Complaint Chief Complaint  Patient presents with  . Abdominal Pain    HPI Javier Moore is a 33 y.o. male.     The history is provided by the patient.  Abdominal Pain  Pain location:  LUQ and epigastric Pain quality: cramping   Pain radiates to:  Does not radiate Pain severity:  Severe Onset quality:  Gradual Timing:  Constant Progression:  Unchanged Chronicity:  New Context: eating   Relieved by:  Nothing Worsened by:  Nothing Ineffective treatments:  None tried Associated symptoms: belching   Associated symptoms: no anorexia, no chest pain, no chills, no constipation, no cough, no fever, no nausea, no shortness of breath and no vomiting   Risk factors: no alcohol abuse   Feels like he has a lot of gas and has been passing a lot of gas and burping a lot.  This is worse at night when he lays flat.  He has been eating pizza and [place] food.  No f/c/r.    Past Medical History:  Diagnosis Date  . Epilepsy (HCC)   . Seizures (HCC)     There are no active problems to display for this patient.   History reviewed. No pertinent surgical history.      Home Medications    Prior to Admission medications   Medication Sig Start Date End Date Taking? Authorizing Provider  amoxicillin (AMOXIL) 875 MG tablet Take 1 tablet (875 mg total) by mouth 2 (two) times daily. 01/28/15   Deatra Canter, FNP  doxylamine, Sleep, (UNISOM) 25 MG tablet Take 75 mg by mouth at bedtime as needed for sleep.    [provider]  HYDROcodone-acetaminophen (NORCO/VICODIN) 5-325 MG per tablet Take 1 tablet by mouth every 4 (four) hours as needed. 04/15/14   Garlon Hatchet, PA-C  ibuprofen (ADVIL,MOTRIN) 800 MG tablet Take 1 tablet (800 mg total) by mouth every 6 (six) hours as needed. 01/04/16   Audry Pili, PA-C  oxyCODONE-acetaminophen  (PERCOCET/ROXICET) 5-325 MG per tablet Take 1-2 tablets by mouth every 6 (six) hours as needed for moderate pain or severe pain. 04/05/14   Antony Madura, PA-C    Family History No family history on file.  Social History Social History   Tobacco Use  . Smoking status: Current Every Day Smoker    Packs/day: 0.50    Types: Cigarettes  . Smokeless tobacco: Never Used  Substance Use Topics  . Alcohol use: No  . Drug use: Yes    Frequency: 7.0 times per week    Types: Marijuana     Allergies   Patient has no known allergies.   Review of Systems Review of Systems  Constitutional: Negative for chills and fever.  Respiratory: Negative for cough and shortness of breath.   Cardiovascular: Negative for chest pain.  Gastrointestinal: Positive for abdominal pain. Negative for anorexia, constipation, nausea and vomiting.  All other systems reviewed and are negative.    Physical Exam Updated Vital Signs BP (!) 144/95   Pulse 69   Temp (!) 97.5 F (36.4 C) (Oral)   Resp 20   Ht 5\' 10"  (1.778 m)   Wt 77.1 kg   SpO2 96%   BMI 24.39 kg/m   Physical Exam Vitals signs and nursing note reviewed.  Constitutional:      General: He is not in acute distress.  Appearance: Normal appearance.  HENT:     Head: Normocephalic and atraumatic.     Nose: Nose normal.     Mouth/Throat:     Mouth: Mucous membranes are moist.     Pharynx: Oropharynx is clear.  Eyes:     Conjunctiva/sclera: Conjunctivae normal.     Pupils: Pupils are equal, round, and reactive to light.  Neck:     Musculoskeletal: Normal range of motion and neck supple.  Cardiovascular:     Rate and Rhythm: Normal rate and regular rhythm.     Pulses: Normal pulses.     Heart sounds: Normal heart sounds.  Pulmonary:     Effort: Pulmonary effort is normal.     Breath sounds: Normal breath sounds.  Abdominal:     General: Abdomen is flat. Bowel sounds are increased.     Palpations: Abdomen is soft.     Tenderness:  There is no abdominal tenderness. There is no guarding or rebound. Negative signs include Murphy's sign, Rovsing's sign and McBurney's sign.     Hernia: No hernia is present.     Comments: Gassy throughout  Musculoskeletal: Normal range of motion.  Skin:    General: Skin is warm and dry.     Capillary Refill: Capillary refill takes less than 2 seconds.  Neurological:     General: No focal deficit present.     Mental Status: He is alert and oriented to person, place, and time.  Psychiatric:        Mood and Affect: Mood normal.        Behavior: Behavior normal.      ED Treatments / Results  Labs (all labs ordered are listed, but only abnormal results are displayed) Results for orders placed or performed during the hospital encounter of 06/06/18  Lipase, blood  Result Value Ref Range   Lipase 53 (H) 11 - 51 U/L  Comprehensive metabolic panel  Result Value Ref Range   Sodium 138 135 - 145 mmol/L   Potassium 4.3 3.5 - 5.1 mmol/L   Chloride 104 98 - 111 mmol/L   CO2 26 22 - 32 mmol/L   Glucose, Bld 106 (H) 70 - 99 mg/dL   BUN 12 6 - 20 mg/dL   Creatinine, Ser 1.61 (H) 0.61 - 1.24 mg/dL   Calcium 9.1 8.9 - 09.6 mg/dL   Total Protein 7.4 6.5 - 8.1 g/dL   Albumin 4.0 3.5 - 5.0 g/dL   AST 21 15 - 41 U/L   ALT 15 0 - 44 U/L   Alkaline Phosphatase 59 38 - 126 U/L   Total Bilirubin 0.3 0.3 - 1.2 mg/dL   GFR calc non Af Amer >60 >60 mL/min   GFR calc Af Amer >60 >60 mL/min   Anion gap 8 5 - 15  CBC  Result Value Ref Range   WBC 6.0 4.0 - 10.5 K/uL   RBC 5.16 4.22 - 5.81 MIL/uL   Hemoglobin 15.3 13.0 - 17.0 g/dL   HCT 04.5 40.9 - 81.1 %   MCV 91.3 80.0 - 100.0 fL   MCH 29.7 26.0 - 34.0 pg   MCHC 32.5 30.0 - 36.0 g/dL   RDW 91.4 78.2 - 95.6 %   Platelets 172 150 - 400 K/uL   nRBC 0.0 0.0 - 0.2 %  Urinalysis, Routine w reflex microscopic  Result Value Ref Range   Color, Urine YELLOW YELLOW   APPearance HAZY (A) CLEAR   Specific Gravity, Urine 1.035 (H) 1.005 - 1.030  pH  5.0 5.0 - 8.0   Glucose, UA NEGATIVE NEGATIVE mg/dL   Hgb urine dipstick NEGATIVE NEGATIVE   Bilirubin Urine NEGATIVE NEGATIVE   Ketones, ur 5 (A) NEGATIVE mg/dL   Protein, ur NEGATIVE NEGATIVE mg/dL   Nitrite NEGATIVE NEGATIVE   Leukocytes,Ua TRACE (A) NEGATIVE   RBC / HPF 6-10 0 - 5 RBC/hpf   WBC, UA 0-5 0 - 5 WBC/hpf   Bacteria, UA NONE SEEN NONE SEEN   Squamous Epithelial / LPF 0-5 0 - 5   Mucus PRESENT    Dg Abd Acute 2+v W 1v Chest  Result Date: 06/07/2018 CLINICAL DATA:  Upper abdominal pain. Question constipation. EXAM: DG ABDOMEN ACUTE W/ 1V CHEST COMPARISON:  None. FINDINGS: The cardiomediastinal contours are normal. The lungs are clear. There is no free intra-abdominal air. No dilated bowel loops to suggest obstruction. Moderate stool in the right colon, with small volume of stool distally. No abnormal rectal distention. No radiopaque calculi. No acute osseous abnormalities are seen. IMPRESSION: 1. Normal bowel gas pattern. Moderate stool in the right colon. 2. Clear lungs. Electronically Signed   By: Narda Rutherford M.D.   On: 06/07/2018 02:03    EKG None  Radiology Dg Abd Acute 2+v W 1v Chest  Result Date: 06/07/2018 CLINICAL DATA:  Upper abdominal pain. Question constipation. EXAM: DG ABDOMEN ACUTE W/ 1V CHEST COMPARISON:  None. FINDINGS: The cardiomediastinal contours are normal. The lungs are clear. There is no free intra-abdominal air. No dilated bowel loops to suggest obstruction. Moderate stool in the right colon, with small volume of stool distally. No abnormal rectal distention. No radiopaque calculi. No acute osseous abnormalities are seen. IMPRESSION: 1. Normal bowel gas pattern. Moderate stool in the right colon. 2. Clear lungs. Electronically Signed   By: Narda Rutherford M.D.   On: 06/07/2018 02:03    Procedures Procedures (including critical care time)  Medications Ordered in ED Medications  sodium chloride flush (NS) 0.9 % injection 3 mL (has no  administration in time range)  alum & mag hydroxide-simeth (MAALOX/MYLANTA) 200-200-20 MG/5ML suspension 30 mL (30 mLs Oral Given 06/07/18 0208)    And  lidocaine (XYLOCAINE) 2 % viscous mouth solution 15 mL (15 mLs Oral Given 06/07/18 0208)  simethicone (MYLICON) chewable tablet 40 mg (40 mg Oral Given 06/07/18 0209)  dicyclomine (BENTYL) injection 20 mg (20 mg Intramuscular Given 06/07/18 0209)    Symptoms as consistent with GERD, patient also has a lot of gas and constipation seen on XR.  Neither history, labs nor exam are consistent with biliary etiology at this time.  But patient has been instructed to eat a bland diet, start omeprazole, and miralax and gas x.  Strict return precautions given.    Final Clinical Impressions(s) / ED Diagnoses   Return for pain, intractable cough, fevers >100.4 unrelieved by medication, shortness of breath, intractable vomiting, chest pain, shortness of breath, weakness numbness, changes in speech, facial asymmetry,abdominal pain, passing out,Inability to tolerate liquids or food, cough, altered mental status or any concerns. No signs of systemic illness or infection. The patient is nontoxic-appearing on exam and vital signs are within normal limits.   I have reviewed the triage vital signs and the nursing notes. Pertinent labs &imaging results that were available during my care of the patient were reviewed by me and considered in my medical decision making (see chart for details).  After history, exam, and medical workup I feel the patient has been appropriately medically screened and is safe for  discharge home. Pertinent diagnoses were discussed with the patient. Patient was given return precautions.   Kervin Bones, MD 06/07/18 [phone]
